# Patient Record
Sex: Male | Born: 1985 | Race: White | Hispanic: No | Marital: Married | Smoking: Never smoker
Health system: Southern US, Community
[De-identification: ages and names within clinical notes are randomized; demographics above are authoritative.]

## PROBLEM LIST (undated history)

## (undated) DIAGNOSIS — J45909 Unspecified asthma, uncomplicated: Secondary | ICD-10-CM

## (undated) DIAGNOSIS — J302 Other seasonal allergic rhinitis: Secondary | ICD-10-CM

## (undated) HISTORY — DX: Unspecified asthma, uncomplicated: J45.909

## (undated) HISTORY — DX: Other seasonal allergic rhinitis: J30.2

---

## 2007-07-12 LAB — LIPID PANEL: Cholesterol: 168 mg/dL (ref 0–200)

## 2010-04-30 LAB — CBC AND DIFFERENTIAL
Hemoglobin: 16 g/dL (ref 13.5–17.5)
Platelets: 295 10*3/uL (ref 150–399)
WBC: 8.9 10^3/mL

## 2010-04-30 LAB — TSH: TSH: 1.5 u[IU]/mL (ref <=5.90)

## 2010-04-30 LAB — HEPATIC FUNCTION PANEL: Alkaline Phosphatase: 114 U/L (ref 25–125)

## 2010-04-30 LAB — BASIC METABOLIC PANEL: Creatinine: 0.9 mg/dL (ref <=1.3)

## 2010-04-30 LAB — HEMOGLOBIN A1C: Hgb A1c MFr Bld: 5.4 % (ref 4.0–6.0)

## 2011-11-09 ENCOUNTER — Ambulatory Visit (INDEPENDENT_AMBULATORY_CARE_PROVIDER_SITE_OTHER): Payer: Self-pay | Admitting: Family Medicine

## 2011-11-09 ENCOUNTER — Encounter: Payer: Self-pay | Admitting: Family Medicine

## 2011-11-09 VITALS — BP 139/95 | HR 91 | Wt 190.0 lb

## 2011-11-09 DIAGNOSIS — J302 Other seasonal allergic rhinitis: Secondary | ICD-10-CM

## 2011-11-09 DIAGNOSIS — R1310 Dysphagia, unspecified: Secondary | ICD-10-CM

## 2011-11-09 DIAGNOSIS — J309 Allergic rhinitis, unspecified: Secondary | ICD-10-CM

## 2011-11-09 DIAGNOSIS — R030 Elevated blood-pressure reading, without diagnosis of hypertension: Secondary | ICD-10-CM

## 2011-11-09 DIAGNOSIS — Z13220 Encounter for screening for lipoid disorders: Secondary | ICD-10-CM

## 2011-11-09 DIAGNOSIS — Z0000 Encounter for general adult medical examination without abnormal findings: Secondary | ICD-10-CM

## 2011-11-09 NOTE — Progress Notes (Signed)
CC: Austin Powers is a 26 y.o. male is here for Establish Care and lump in throat   Subjective: HPI:  Pleasant 26 year old recently moved from [facility] here to establish care.  His only acute complaint is a lump in the throat and a sensation of food getting stuck in his throat. This is been present for 2-3 months and is getting worse. Initially was just with foods but now occurs with some fluids and even with spit.  He believes she's also been spitting up a little bit when he is roughhousing with his kids and bending forward. He denies vomiting otherwise, nausea, abdominal pain, or sour taste in the back of his mouth. He denies any swelling of the neck or trouble breathing. He denies tobacco use and classifies himself as a rare alcohol drinker. He used an over-the-counter antacid for couple days and that didn't seem to help his symptoms.  No other interventions. Nothing else seems to make it better or worse. He's never had this before. He denies fevers, chills, shortness of breath, cough, unintentional weight loss, swollen lymph nodes, posterior neck pain, nor chest pain. Denies constipation or diarrhea  Pointed out his blood pressure today, stage I hypertension, he tells me he's never been told that he has elevated blood pressure in the past but admits that he is somewhat anxious being a new doctor's office  Review Of Systems Outlined In HPI  Past Medical History  Diagnosis Date  . Seasonal allergies      No family history on file.   History  Substance Use Topics  . Smoking status: Never Smoker   . Smokeless tobacco: Not on file  . Alcohol Use: Yes     Objective: Filed Vitals:   11/09/11 0913  BP: 139/95  Pulse: 91    General: Alert and Oriented, No Acute Distress HEENT: Pupils equal, round, reactive to light. Conjunctivae clear.  External ears unremarkable, canals clear with intact TMs with appropriate landmarks.  Middle ear appears open without effusion. Pink  inferior turbinates.  Moist mucous membranes, pharynx without inflammation nor lesions.  Neck supple without palpable lymphadenopathy nor abnormal masses. Lungs: Clear to auscultation bilaterally, no wheezing/ronchi/rales.  Comfortable work of breathing. Good air movement. Cardiac: Regular rate and rhythm. Normal S1/S2.  No murmurs, rubs, nor gallops.   Abdomen: Normal bowel sounds, soft and non tender without palpable masses. Mental Status: No depression, anxiety, nor agitation. Skin: Warm and dry.  Assessment & Plan: Caster was seen today for establish care and lump in throat.  Diagnoses and associated orders for this visit:  Seasonal allergies  Elevated blood-pressure reading without diagnosis of hypertension - BASIC METABOLIC PANEL WITH GFR  Dysphagia - DG Esophagus; Future  Need for lipid screening - Lipid panel  Routine health maintenance - Lipid panel - BASIC METABOLIC PANEL WITH GFR    Esophageal motility study as above, asked him to return in 3-4 weeks for complete physical exam at that point we'll assess if he still has an elevated blood pressure that needs to be treated. He believes it's been more than 5 years since his last cholesterol check, he does not believe it is ever been screened for diabetes therefore labs above have been ordered to be obtained just prior to his complete physical exam.   Return in about 4 days (around 11/13/2011).

## 2011-11-09 NOTE — Patient Instructions (Signed)
I will call you once your swallow test results are back.

## 2011-11-14 ENCOUNTER — Encounter: Payer: Self-pay | Admitting: Family Medicine

## 2011-11-16 ENCOUNTER — Encounter: Payer: Self-pay | Admitting: Family Medicine

## 2011-11-16 DIAGNOSIS — Z87898 Personal history of other specified conditions: Secondary | ICD-10-CM | POA: Insufficient documentation

## 2011-11-16 DIAGNOSIS — Z872 Personal history of diseases of the skin and subcutaneous tissue: Secondary | ICD-10-CM | POA: Insufficient documentation

## 2011-11-16 DIAGNOSIS — N503 Cyst of epididymis: Secondary | ICD-10-CM | POA: Insufficient documentation

## 2011-11-24 LAB — LIPID PANEL
Cholesterol: 161 mg/dL (ref 0–200)
HDL: 33 mg/dL — ABNORMAL LOW (ref >39)
Total CHOL/HDL Ratio: 4.9 Ratio
Triglycerides: 167 mg/dL — ABNORMAL HIGH (ref <150)

## 2011-11-25 ENCOUNTER — Encounter: Payer: Self-pay | Admitting: Family Medicine

## 2011-11-25 LAB — BASIC METABOLIC PANEL WITH GFR
BUN: 12 mg/dL (ref 6–23)
CO2: 24 mEq/L (ref 19–32)
Calcium: 10.1 mg/dL (ref 8.4–10.5)
GFR, Est African American: >89 mL/min
Glucose, Bld: 85 mg/dL (ref 70–99)
Sodium: 136 mEq/L (ref 135–145)

## 2011-11-29 ENCOUNTER — Encounter: Payer: Self-pay | Admitting: Family Medicine

## 2011-11-30 ENCOUNTER — Encounter: Payer: Self-pay | Admitting: Family Medicine

## 2012-02-25 ENCOUNTER — Other Ambulatory Visit: Payer: Self-pay

## 2012-03-14 ENCOUNTER — Encounter: Payer: Self-pay | Admitting: Family Medicine

## 2012-03-15 ENCOUNTER — Telehealth: Payer: Self-pay | Admitting: Family Medicine

## 2012-03-15 DIAGNOSIS — Z3009 Encounter for other general counseling and advice on contraception: Secondary | ICD-10-CM

## 2012-03-15 NOTE — Telephone Encounter (Signed)
Sue Lush, Will you please let Austin Powers know that I've had guys happy with [facility], they're based out of [place].  I'll put in a referral for him.

## 2012-03-15 NOTE — Telephone Encounter (Signed)
Left message on vm

## 2012-11-15 ENCOUNTER — Other Ambulatory Visit: Payer: Self-pay

## 2013-01-09 DIAGNOSIS — N50819 Testicular pain, unspecified: Secondary | ICD-10-CM | POA: Insufficient documentation

## 2013-06-12 ENCOUNTER — Encounter: Payer: Self-pay | Admitting: Family Medicine

## 2013-06-12 ENCOUNTER — Telehealth: Payer: Self-pay | Admitting: Family Medicine

## 2013-06-12 DIAGNOSIS — Z8379 Family history of other diseases of the digestive system: Secondary | ICD-10-CM

## 2013-06-12 NOTE — Telephone Encounter (Signed)
Left message on vm and lab order up front

## 2013-06-12 NOTE — Telephone Encounter (Signed)
Seth Bake, Will you please make this (labs in inbox) available to patient, I'm emailing him that these labs are available.  From: Sundra Aland Sent: 06/12/2013 7:08 AM To: [facility] Clinical Pool Subject: Non-Urgent Medical Question My daughter (who has T1 diabetes) was diagnosed with Celiac disease last month. Her GI doctor, Dr. Marissa Nestle, recommended all of our immediate family be tested for Celiac as well. Is this a blood test I could have done at the lab in the [place] office to see if I might also have some degree of Celiac disease? Thank you!

## 2013-06-14 ENCOUNTER — Encounter: Payer: Self-pay | Admitting: Family Medicine

## 2013-06-14 ENCOUNTER — Ambulatory Visit (INDEPENDENT_AMBULATORY_CARE_PROVIDER_SITE_OTHER): Payer: BLUE CROSS/BLUE SHIELD | Admitting: Family Medicine

## 2013-06-14 VITALS — BP 133/91 | HR 92 | Ht 67.0 in | Wt 181.0 lb

## 2013-06-14 DIAGNOSIS — Z0000 Encounter for general adult medical examination without abnormal findings: Secondary | ICD-10-CM

## 2013-06-14 DIAGNOSIS — E785 Hyperlipidemia, unspecified: Secondary | ICD-10-CM

## 2013-06-14 DIAGNOSIS — L259 Unspecified contact dermatitis, unspecified cause: Secondary | ICD-10-CM

## 2013-06-14 DIAGNOSIS — L309 Dermatitis, unspecified: Secondary | ICD-10-CM

## 2013-06-14 DIAGNOSIS — Z131 Encounter for screening for diabetes mellitus: Secondary | ICD-10-CM

## 2013-06-14 DIAGNOSIS — R748 Abnormal levels of other serum enzymes: Secondary | ICD-10-CM

## 2013-06-14 LAB — COMPLETE METABOLIC PANEL WITH GFR
ALBUMIN: 4.8 g/dL (ref 3.5–5.2)
ALT: 28 U/L (ref 0–53)
AST: 19 U/L (ref 0–37)
Alkaline Phosphatase: 74 U/L (ref 39–117)
BUN: 10 mg/dL (ref 6–23)
CALCIUM: 9.6 mg/dL (ref 8.4–10.5)
CHLORIDE: 100 meq/L (ref 96–112)
CO2: 27 mEq/L (ref 19–32)
Creat: 0.93 mg/dL (ref 0.50–1.35)
GFR, Est African American: >89 mL/min
GFR, Est Non African American: >89 mL/min
Glucose, Bld: 91 mg/dL (ref 70–99)
POTASSIUM: 4.6 meq/L (ref 3.5–5.3)
SODIUM: 139 meq/L (ref 135–145)
TOTAL PROTEIN: 7.7 g/dL (ref 6.0–8.3)
Total Bilirubin: 0.8 mg/dL (ref 0.2–1.2)

## 2013-06-14 LAB — LIPID PANEL
CHOLESTEROL: 160 mg/dL (ref 0–200)
HDL: 28 mg/dL — ABNORMAL LOW (ref >39)
LDL Cholesterol: 107 mg/dL — ABNORMAL HIGH (ref 0–99)
Total CHOL/HDL Ratio: 5.7 Ratio
Triglycerides: 124 mg/dL (ref <150)
VLDL: 25 mg/dL (ref 0–40)

## 2013-06-14 MED ORDER — TRIAMCINOLONE ACETONIDE 0.1 % EX CREA: TOPICAL_CREAM | CUTANEOUS | Status: AC

## 2013-06-14 NOTE — Patient Instructions (Addendum)
Dr. Lajoyce Lauber General Advice Following Your Complete Physical Exam  The Benefits of Regular Exercise: Unless you suffer from an uncontrolled cardiovascular condition, studies strongly suggest that regular exercise and physical activity will add to both the quality and length of your life.  The World Health Organization recommends 150 minutes of moderate intensity aerobic activity every week.  This is best split over 3-4 days a week, and can be as simple as a brisk walk for just over 35 minutes "most days of the week".  This type of exercise has been shown to lower LDL-Cholesterol, lower average blood sugars, lower blood pressure, lower cardiovascular disease risk, improve memory, and increase one's overall sense of wellbeing.  The addition of anaerobic (or "strength training") exercises offers additional benefits including but not limited to increased metabolism, prevention of osteoporosis, and improved overall cholesterol levels.  How Can I Strive For A Low-Fat Diet?: Current guidelines recommend that 25-35 percent of your daily energy (food) intake should come from fats.  One might ask how can this be achieved without having to dissect each meal on a daily basis?  Switch to skim or 1% milk instead of whole milk.  Focus on lean meats such as ground [place], fresh fish, baked chicken, and lean cuts of beef as your source of dietary protein.  Limit saturated fat consumption to less than 10% of your daily caloric intake.  Limit trans fatty acid consumption primarily by limiting synthetic trans fats such as partially hydrogenated oils (Ex: fried fast foods).  Substitute olive or vegetable oil for solid fats where possible.  Moderation of Salt Intake: Provided you don't carry a diagnosis of congestive heart failure nor renal failure, I recommend a daily allowance of no more than 2300 mg of salt (sodium).  Keeping under this daily goal is associated with a decreased risk of cardiovascular events, creeping  above it can lead to elevated blood pressures and increases your risk of cardiovascular events.  Milligrams (mg) of salt is listed on all nutrition labels, and your daily intake can add up faster than you think.  Most canned and frozen dinners can pack in over half your daily salt allowance in one meal.    Lifestyle Health Risks: Certain lifestyle choices carry specific health risks.  As you may already know, tobacco use has been associated with increasing one's risk of cardiovascular disease, pulmonary disease, numerous cancers, among many other issues.  What you may not know is that there are medications and nicotine replacement strategies that can more than double your chances of successfully quitting.  I would be thrilled to help manage your quitting strategy if you currently use tobacco products.  When it comes to alcohol use, I've yet to find an "ideal" daily allowance.  Provided an individual does not have a medical condition that is exacerbated by alcohol consumption, general guidelines determine "safe drinking" as no more than two standard drinks for a man or no more than one standard drink for a male per day.  However, much debate still exists on whether any amount of alcohol consumption is technically "safe".  My general advice, keep alcohol consumption to a minimum for general health promotion.  If you or others believe that alcohol, tobacco, or recreational drug use is interfering with your life, I would be happy to provide confidential counseling regarding treatment options.  General "Over The Counter" Nutrition Advice: Postmenopausal women should aim for a daily calcium intake of 1200 mg, however a significant portion of this might already be  provided by diets including milk, yogurt, cheese, and other dairy products.  Vitamin D has been shown to help preserve bone density, prevent fatigue, and has even been shown to help reduce falls in the elderly.  Ensuring a daily intake of 800 Units of  Vitamin D is a good place to start to enjoy the above benefits, we can easily check your Vitamin D level to see if you'd potentially benefit from supplementation beyond 800 Units a day.  Folic Acid intake should be of particular concern to women of childbearing age.  Daily consumption of 413-244 mcg of Folic Acid is recommended to minimize the chance of spinal cord defects in a fetus should pregnancy occur.    For many adults, accidents still remain one of the most common culprits when it comes to cause of death.  Some of the simplest but most effective preventitive habits you can adopt include regular seatbelt use, proper helmet use, securing firearms, and regularly testing your smoke and carbon monoxide detectors.  Barnett Elzey B. [place] [address], [place], [place] [address] Phone: [phone]  Testicular Self-Exam A self-examination of your testicles involves looking at and feeling your testicles for abnormal lumps or swelling. Several things can cause swelling, lumps, or pain in your testicles. Some of these causes are:  Injuries.  Inflammation.  Infection.  Accumulation of fluids around your testicle (hydrocele).  Twisted testicles (testicular torsion).  Testicular cancer. Self-examination of the testicles and groin areas may be advised if you are at risk for testicular cancer. Risks for testicular cancer include:  An undescended testicle (cryptorchidism).  A history of previous testicular cancer.  A family history of testicular cancer. The testicles are easiest to examine after warm baths or showers and are more difficult to examine when you are cold. This is because the muscles attached to the testicles retract and pull them up higher or into the abdomen. Follow these steps while you are standing:  Hold your penis away from your body.  Roll one testicle between your thumb and forefinger, feeling the entire testicle.  Roll the other  testicle between your thumb and forefinger, feeling the entire testicle. Feel for lumps, swelling, or discomfort. A normal testicle is egg shaped and feels firm. It is smooth and not tender. The spermatic cord can be felt as a firm spaghetti-like cord at the back of your testicle. It is also important to examine the crease between the front of your leg and your abdomen. Feel for any bumps that are tender. These could be enlarged lymph nodes.  Document Released: 04/04/2000 Document Revised: 08/29/2012 Document Reviewed: 06/18/2012 [facility] Patient Information 2014 [place], [place].

## 2013-06-14 NOTE — Progress Notes (Signed)
CC: Austin Powers is a 28 y.o. male is here for Annual Exam   Subjective: HPI:  Colonoscopy: No current indication Prostate: Discussed screening risks/beneifts with patient during today's visit, no current indication for prostate cancer screening  Influenza Vaccine: Out of season Pneumovax: No current indication Td/Tdap: Tdap 2014 Zoster: (Start 30 yo)  No alcohol, tobacco, recreational drug use. No formal exercise routine. He has significantly changed his diet over the past month due to a daughter who was diagnosed with celiac disease. He's eating much more vegetables now.  His only complaint is a rash on the back that is itchy but he believes may be eczema, improves with moisturizers  Review of Systems - General ROS: negative for - chills, fever, night sweats, weight gain or weight loss Ophthalmic ROS: negative for - decreased vision Psychological ROS: negative for - anxiety or depression ENT ROS: negative for - hearing change, nasal congestion, tinnitus or allergies Hematological and Lymphatic ROS: negative for - bleeding problems, bruising or swollen lymph nodes Breast ROS: negative Respiratory ROS: no cough, shortness of breath, or wheezing Cardiovascular ROS: no chest pain or dyspnea on exertion Gastrointestinal ROS: no abdominal pain, change in bowel habits, or black or bloody stools Genito-Urinary ROS: negative for - genital discharge, genital ulcers, incontinence or abnormal bleeding from genitals Musculoskeletal ROS: negative for - joint pain or muscle pain Neurological ROS: negative for - headaches or memory loss Dermatological ROS: negative for lumps, mole changes, rash and skin lesion changes other than that described above  Past Medical History  Diagnosis Date  . Seasonal allergies   . Asthma     No past surgical history on file. No family history on file.  History   Social History  . Marital Status: Married    Spouse Name: N/A    Number of Children: N/A  .  Years of Education: N/A   Occupational History  . Not on file.   Social History Main Topics  . Smoking status: Never Smoker   . Smokeless tobacco: Not on file  . Alcohol Use: Yes  . Drug Use: No  . Sexual Activity: Not on file   Other Topics Concern  . Not on file   Social History Narrative  . No narrative on file     Objective: BP 133/91  Pulse 92  Ht 5\' 7"  (1.702 m)  Wt 181 lb (82.101 kg)  BMI 28.34 kg/m2  General: No Acute Distress HEENT: Atraumatic, normocephalic, conjunctivae normal without scleral icterus.  No nasal discharge, hearing grossly intact, TMs with good landmarks bilaterally with no middle ear abnormalities, posterior pharynx clear without oral lesions. Neck: Supple, trachea midline, no cervical nor supraclavicular adenopathy. Pulmonary: Clear to auscultation bilaterally without wheezing, rhonchi, nor rales. Cardiac: Regular rate and rhythm.  No murmurs, rubs, nor gallops. No peripheral edema.  2+ peripheral pulses bilaterally. Abdomen: Bowel sounds normal.  No masses.  Non-tender without rebound.  Negative Murphy's sign. GU: Bilateral descended testes without inguinal hernia MSK: Grossly intact, no signs of weakness.  Full strength throughout upper and lower extremities.  Full ROM in upper and lower extremities.  No midline spinal tenderness. Neuro: Gait unremarkable, CN II-XII grossly intact.  C5-C6 Reflex 2/4 Bilaterally, L4 Reflex 2/4 Bilaterally.  Cerebellar function intact. Skin: Mild eczema on the back by the left axilla no other rashes or suspicious lesions Psych: Alert and oriented to person/place/time.  Thought process normal. No anxiety/depression.  Assessment & Plan: Xzaviar was seen today for annual exam.  Diagnoses and  associated orders for this visit:  Annual physical exam  Dyslipidemia - Lipid panel  Diabetes mellitus screening - COMPLETE METABOLIC PANEL WITH GFR  Elevated liver enzymes - COMPLETE METABOLIC PANEL WITH  GFR  Eczema - triamcinolone cream (KENALOG) 0.1 %; Apply to affected areas twice a day for up to two weeks, avoid face.    Healthy lifestyle interventions including but not limited to regular exercise, a healthy low fat diet, moderation of salt intake, the dangers of tobacco/alcohol/recreational drug use, nutrition supplementation, and accident avoidance were discussed with the patient and a handout was provided for future reference. Due for annual lipid panel, checking liver enzymes due to history of elevation, routine diabetic screening Triamcinolone for any eczema flares  Return in about 1 year (around 06/15/2014).

## 2013-06-15 LAB — IGA: IgA: 248 mg/dL (ref 68–379)

## 2013-06-18 LAB — RETICULIN ANTIBODIES, IGA W TITER: RETICULIN AB, IGA: NEGATIVE

## 2013-06-19 ENCOUNTER — Telehealth: Payer: Self-pay | Admitting: Family Medicine

## 2013-06-19 DIAGNOSIS — R768 Other specified abnormal immunological findings in serum: Secondary | ICD-10-CM

## 2013-06-19 NOTE — Telephone Encounter (Signed)
Seth Bake, Will you please let Curt know that his celiac tests came back positive.  This does not confirm the diagnosis but it makes it highly likely especially with his family history.  I've placed a GI referral so he can discuss whether or not a endoscopy is needed to confirm the diagnosis.  Please let me know if not contacted about this by next week.

## 2013-06-19 NOTE — Telephone Encounter (Signed)
Pt.notified

## 2013-06-27 LAB — GLIADIN ANTIBODIES, SERUM
GLIADIN IGA: 15 U/mL (ref <20)
Gliadin IgG: 96.4 U/mL — ABNORMAL HIGH (ref <20)

## 2013-06-27 LAB — TISSUE TRANSGLUTAMINASE, IGA: TISSUE TRANSGLUTAMINASE AB, IGA: 63.4 U/mL — AB (ref <20)

## 2013-07-25 ENCOUNTER — Encounter: Payer: Self-pay | Admitting: Family Medicine

## 2014-04-13 ENCOUNTER — Encounter: Payer: Self-pay | Admitting: Family Medicine

## 2014-04-13 DIAGNOSIS — M79672 Pain in left foot: Secondary | ICD-10-CM

## 2014-04-15 ENCOUNTER — Ambulatory Visit (INDEPENDENT_AMBULATORY_CARE_PROVIDER_SITE_OTHER): Payer: BLUE CROSS/BLUE SHIELD

## 2014-04-15 DIAGNOSIS — R936 Abnormal findings on diagnostic imaging of limbs: Secondary | ICD-10-CM

## 2014-04-15 DIAGNOSIS — M79672 Pain in left foot: Secondary | ICD-10-CM

## 2014-04-16 ENCOUNTER — Encounter: Payer: Self-pay | Admitting: Sports Medicine

## 2014-04-16 ENCOUNTER — Ambulatory Visit (INDEPENDENT_AMBULATORY_CARE_PROVIDER_SITE_OTHER): Payer: BLUE CROSS/BLUE SHIELD | Admitting: Sports Medicine

## 2014-04-16 VITALS — BP 133/89 | HR 69 | Wt 160.0 lb

## 2014-04-16 DIAGNOSIS — M7750 Other enthesopathy of unspecified foot: Secondary | ICD-10-CM | POA: Insufficient documentation

## 2014-04-16 DIAGNOSIS — M79672 Pain in left foot: Secondary | ICD-10-CM

## 2014-04-16 MED ORDER — MELOXICAM 15 MG PO TABS: ORAL_TABLET | ORAL | Status: DC

## 2014-04-16 NOTE — Progress Notes (Signed)
   Subjective:    I'm seeing this patient as a consultation for:  Dr. Ileene Rubens  CC: left foot pain  HPI: This is a pleasant 29 year old male, for the past several weeks he's had increasing pain that he localizes over the dorsum of the first metatarsal shaft on the left, he runs approximately 13 miles per week and would like to increase. Unfortunately she has not had any effect from over-the-counter inserts. Symptoms are moderate, persistent without radiation. No trauma.  Past medical history, Surgical history, Family history not pertinant except as noted below, Social history, Allergies, and medications have been entered into the medical record, reviewed, and no changes needed.   Review of Systems: No headache, visual changes, nausea, vomiting, diarrhea, constipation, dizziness, abdominal pain, skin rash, fevers, chills, night sweats, weight loss, swollen lymph nodes, body aches, joint swelling, muscle aches, chest pain, shortness of breath, mood changes, visual or auditory hallucinations.   Objective:   General: Well Developed, well nourished, and in no acute distress.  Neuro/Psych: Alert and oriented x3, extra-ocular muscles intact, able to move all 4 extremities, sensation grossly intact. Skin: Warm and dry, no rashes noted.  Respiratory: Not using accessory muscles, speaking in full sentences, trachea midline.  Cardiovascular: Pulses palpable, no extremity edema. Abdomen: Does not appear distended. Left Foot: No visible erythema or swelling. Range of motion is full in all directions. Strength is 5/5 in all directions. No hallux valgus. Pes cavus No abnormal callus noted. No pain over the navicular prominence, or base of fifth metatarsal. No tenderness to palpation of the calcaneal insertion of plantar fascia. No pain at the Achilles insertion. No pain over the calcaneal bursa. No pain of the retrocalcaneal bursa. Tender to palpation over the first metatarsal shaft dorsally. No  hallux rigidus or limitus. No tenderness palpation over interphalangeal joints. No pain with compression of the metatarsal heads. Neurovascularly intact distally.  X-rays reviewed and are essentially unremarkable with regards to his first metatarsal.  Impression and Recommendations:   This case required medical decision making of moderate complexity.

## 2014-04-16 NOTE — Assessment & Plan Note (Addendum)
Symptoms likely represent 1st MT stress reaction in this patient with moderate pes cavus. Patient will return for custom orthotics. We will also do a gait analysis at that time. X-rays were negative. Adding meloxicam in the meantime. Does approximately 13 miles per week. We may need to add first metatarsal ray post.

## 2014-04-21 ENCOUNTER — Encounter: Payer: Self-pay | Admitting: Sports Medicine

## 2014-05-02 ENCOUNTER — Encounter: Payer: Self-pay | Admitting: Sports Medicine

## 2014-05-02 ENCOUNTER — Ambulatory Visit (INDEPENDENT_AMBULATORY_CARE_PROVIDER_SITE_OTHER): Payer: BLUE CROSS/BLUE SHIELD | Admitting: Sports Medicine

## 2014-05-02 VITALS — BP 135/90 | HR 67 | Ht 67.0 in | Wt 157.0 lb

## 2014-05-02 DIAGNOSIS — M79672 Pain in left foot: Secondary | ICD-10-CM | POA: Diagnosis not present

## 2014-05-02 NOTE — Progress Notes (Signed)

## 2014-05-02 NOTE — Assessment & Plan Note (Signed)
Administrator as above. Good response and feeling significantly better. Meloxicam has been helpful. Continue to do approximately 13 miles per week, return to see me in one month to see if things are going.

## 2014-06-17 ENCOUNTER — Ambulatory Visit (INDEPENDENT_AMBULATORY_CARE_PROVIDER_SITE_OTHER): Payer: BLUE CROSS/BLUE SHIELD | Admitting: Sports Medicine

## 2014-06-17 ENCOUNTER — Encounter: Payer: Self-pay | Admitting: Sports Medicine

## 2014-06-17 VITALS — BP 140/88 | HR 71 | Ht 67.0 in | Wt 154.0 lb

## 2014-06-17 DIAGNOSIS — M7632 Iliotibial band syndrome, left leg: Secondary | ICD-10-CM | POA: Diagnosis not present

## 2014-06-17 DIAGNOSIS — M79672 Pain in left foot: Secondary | ICD-10-CM | POA: Diagnosis not present

## 2014-06-17 DIAGNOSIS — M79671 Pain in right foot: Secondary | ICD-10-CM | POA: Diagnosis not present

## 2014-06-17 NOTE — Assessment & Plan Note (Signed)
It has now been 3 weeks of rehabilitation exercises without any improvement at all. He continues to do approximately 13 miles per week. Injection under the IT band of the lateral femoral condyle today. No running for a week, return to see me in a month.

## 2014-06-17 NOTE — Progress Notes (Signed)
  Subjective:    CC: Multiple issues  HPI: Left knee pain: With running, popping sensation over the lateral femoral condyle, has been doing stretches for IT band syndrome, unfortunately having persistent pain after over one month.  Foot pain: Left orthotic arch feels a bit high, I'm happy to plan this down a bit, foot has some pain between the third and fourth metatarsal heads, predominantly on the plantar aspect. The  Past medical history, Surgical history, Family history not pertinant except as noted below, Social history, Allergies, and medications have been entered into the medical record, reviewed, and no changes needed.   Review of Systems: No fevers, chills, night sweats, weight loss, chest pain, or shortness of breath.   Objective:    General: Well Developed, well nourished, and in no acute distress.  Neuro: Alert and oriented x3, extra-ocular muscles intact, sensation grossly intact.  HEENT: Normocephalic, atraumatic, pupils equal round reactive to light, neck supple, no masses, no lymphadenopathy, thyroid nonpalpable.  Skin: Warm and dry, no rashes. Cardiac: Regular rate and rhythm, no murmurs rubs or gallops, no lower extremity edema.  Respiratory: Clear to auscultation bilaterally. Not using accessory muscles, speaking in full sentences. Right Foot: No visible erythema or swelling. Range of motion is full in all directions. Strength is 5/5 in all directions. No hallux valgus. No pes cavus or pes planus. No abnormal callus noted. No pain over the navicular prominence, or base of fifth metatarsal. No tenderness to palpation of the calcaneal insertion of plantar fascia. No pain at the Achilles insertion. No pain over the calcaneal bursa. No pain of the retrocalcaneal bursa. No tenderness to palpation over the tarsals, metatarsals, or phalanges. No hallux rigidus or limitus. No tenderness palpation over interphalangeal joints. Minimal tenderness on the plantar aspect of  the third/fourth metatarsal heads as well as the intermetatarsal bursa. Neurovascularly intact distally.  Procedure: Real-time Ultrasound Guided Injection of left iliotibial band Device: GE Logiq E  Verbal informed consent obtained.  Time-out conducted.  Noted no overlying erythema, induration, or other signs of local infection.  Skin prepped in a sterile fashion.  Local anesthesia: Topical Ethyl chloride.  With sterile technique and under real time ultrasound guidance:  A total of 1 mL kenalog 40, 2 mL lidocaine injected just deep to the iliotibial band as it crosses the lateral femoral condyle. Completed without difficulty  Pain immediately resolved suggesting accurate placement of the medication.  Advised to call if fevers/chills, erythema, induration, drainage, or persistent bleeding.  Images permanently stored and available for review in the ultrasound unit.  Impression: Technically successful ultrasound guided injection.  Impression and Recommendations:

## 2014-06-17 NOTE — Assessment & Plan Note (Signed)
Overall doing well with custom orthotics. I did plane down the left orthotic arch a little bit today. I added a metatarsal pad on the right orthotic, he probably has intermetatarsal bursitis at the 3/4 junction. Return in one month regarding all of this.

## 2014-07-22 ENCOUNTER — Ambulatory Visit (INDEPENDENT_AMBULATORY_CARE_PROVIDER_SITE_OTHER): Payer: BLUE CROSS/BLUE SHIELD | Admitting: Family Medicine

## 2014-07-22 ENCOUNTER — Encounter: Payer: Self-pay | Admitting: Family Medicine

## 2014-07-22 ENCOUNTER — Ambulatory Visit (INDEPENDENT_AMBULATORY_CARE_PROVIDER_SITE_OTHER): Payer: BLUE CROSS/BLUE SHIELD | Admitting: Sports Medicine

## 2014-07-22 ENCOUNTER — Encounter: Payer: Self-pay | Admitting: Sports Medicine

## 2014-07-22 VITALS — BP 120/77 | HR 69 | Ht 67.0 in | Wt 148.0 lb

## 2014-07-22 DIAGNOSIS — Z0000 Encounter for general adult medical examination without abnormal findings: Secondary | ICD-10-CM | POA: Diagnosis not present

## 2014-07-22 DIAGNOSIS — M7632 Iliotibial band syndrome, left leg: Secondary | ICD-10-CM

## 2014-07-22 DIAGNOSIS — M79672 Pain in left foot: Secondary | ICD-10-CM

## 2014-07-22 DIAGNOSIS — M79671 Pain in right foot: Secondary | ICD-10-CM

## 2014-07-22 NOTE — Progress Notes (Signed)
CC: Austin Powers is a 29 y.o. male is here for Annual Exam   Subjective: HPI:  Colonoscopy: No current indication Prostate: Discussed screening risks/beneifts with patient during today's visit, no current indication for prostate cancer screening  Influenza Vaccine: Out of season Pneumovax: No current indication Td/Tdap: Tdap 2014 Zoster: (Start 31 yo)  No alcohol, tobacco, recreational drug use. No formal exercise routine. He has significantly changed his diet over the past year due to a daughter who was diagnosed with celiac disease.  His only complaint is finding a tick in the suprapubic region this morning. He took it off without difficulty. He's had no rash, fevers, headache, chills, new joint pain or muscle aches.  Review of Systems - General ROS: negative for - chills, fever, night sweats, weight gain or weight loss Ophthalmic ROS: negative for - decreased vision Psychological ROS: negative for - anxiety or depression ENT ROS: negative for - hearing change, nasal congestion, tinnitus or allergies Hematological and Lymphatic ROS: negative for - bleeding problems, bruising or swollen lymph nodes Breast ROS: negative Respiratory ROS: no cough, shortness of breath, or wheezing Cardiovascular ROS: no chest pain or dyspnea on exertion Gastrointestinal ROS: no abdominal pain, change in bowel habits, or black or bloody stools Genito-Urinary ROS: negative for - genital discharge, genital ulcers, incontinence or abnormal bleeding from genitals Musculoskeletal ROS: negative for - joint pain or muscle pain Neurological ROS: negative for - headaches or memory loss Dermatological ROS: negative for lumps, mole changes, rash and skin lesion changes  Past Medical History  Diagnosis Date  . Seasonal allergies   . Asthma     No past surgical history on file. No family history on file.  History   Social History  . Marital Status: Married    Spouse Name: N/A  . Number of Children: N/A  .  Years of Education: N/A   Occupational History  . Not on file.   Social History Main Topics  . Smoking status: Never Smoker   . Smokeless tobacco: Not on file  . Alcohol Use: Yes  . Drug Use: No  . Sexual Activity: Not on file   Other Topics Concern  . Not on file   Social History Narrative     Objective: BP 120/77 mmHg  Pulse 69  Ht 5\' 7"  (1.702 m)  Wt 148 lb (67.132 kg)  BMI 23.17 kg/m2  General: No Acute Distress HEENT: Atraumatic, normocephalic, conjunctivae normal without scleral icterus.  No nasal discharge, hearing grossly intact, TMs with good landmarks bilaterally with no middle ear abnormalities, posterior pharynx clear without oral lesions. Neck: Supple, trachea midline, no cervical nor supraclavicular adenopathy. Pulmonary: Clear to auscultation bilaterally without wheezing, rhonchi, nor rales. Cardiac: Regular rate and rhythm.  No murmurs, rubs, nor gallops. No peripheral edema.  2+ peripheral pulses bilaterally. Abdomen: Bowel sounds normal.  No masses.  Non-tender without rebound.  Negative Murphy's sign. MSK: Grossly intact, no signs of weakness.  Full strength throughout upper and lower extremities.  Full ROM in upper and lower extremities.  No midline spinal tenderness. Neuro: Gait unremarkable, CN II-XII grossly intact.  C5-C6 Reflex 2/4 Bilaterally, L4 Reflex 2/4 Bilaterally.  Cerebellar function intact. Skin: No rashes other than some mild erythema in the suprapubic region where he removed a tick this morning. No signs of infection. Psych: Alert and oriented to person/place/time.  Thought process normal. No anxiety/depression.  Assessment & Plan: Austin Powers was seen today for annual exam.  Diagnoses and all orders for this visit:  Annual physical exam Orders: -     Lipid panel -     CBC -     COMPLETE METABOLIC PANEL WITH GFR   Healthy lifestyle interventions including but not limited to regular exercise, a healthy low fat diet, moderation of salt  intake, the dangers of tobacco/alcohol/recreational drug use, nutrition supplementation, and accident avoidance were discussed with the patient and a handout was provided for future reference.  Discussed signs and symptoms of Lyme disease were [facility] spotted fever and that if he begins to experience any of these to call me and I will call in doxycycline. Very low suspicion of [facility] spotted fever or Lyme's disease at this time.  Return in about 1 year (around 07/22/2015).

## 2014-07-22 NOTE — Assessment & Plan Note (Signed)
Foot pain and right 3/4 intermetatarsal bursitis has resolved with metatarsal pad placement. I have given a couple of scaphoid pads to place should he desire increased support the arch. Return as needed.

## 2014-07-22 NOTE — Assessment & Plan Note (Signed)
Injection was only minimally effective for iliotibial band syndrome. Small compression brace has been significantly more effective. Continue running, next up would be MRI of the knee. Symptoms continue be described as a tightness along the lateral thigh and vastus lateralis.

## 2014-07-22 NOTE — Progress Notes (Signed)
  Subjective:    CC: Follow-up  HPI: Right foot pain: 3/4 intermetatarsal bursitis: Resolved with metatarsal pad placement in orthotics.  Left IT band syndrome: Injection was only minimally effective, he did buy a compression brace that has been more effective. Happy with how things are going so far.  Past medical history, Surgical history, Family history not pertinant except as noted below, Social history, Allergies, and medications have been entered into the medical record, reviewed, and no changes needed.   Review of Systems: No fevers, chills, night sweats, weight loss, chest pain, or shortness of breath.   Objective:    General: Well Developed, well nourished, and in no acute distress.  Neuro: Alert and oriented x3, extra-ocular muscles intact, sensation grossly intact.  HEENT: Normocephalic, atraumatic, pupils equal round reactive to light, neck supple, no masses, no lymphadenopathy, thyroid nonpalpable.  Skin: Warm and dry, no rashes. Cardiac: Regular rate and rhythm, no murmurs rubs or gallops, no lower extremity edema.  Respiratory: Clear to auscultation bilaterally. Not using accessory muscles, speaking in full sentences.  Impression and Recommendations:    I spent 25 minutes with this patient, greater than 50% was face-to-face time counseling regarding the above diagnoses

## 2014-07-23 LAB — COMPLETE METABOLIC PANEL WITH GFR
ALBUMIN: 4.9 g/dL (ref 3.5–5.2)
ALK PHOS: 68 U/L (ref 39–117)
ALT: 17 U/L (ref 0–53)
AST: 17 U/L (ref 0–37)
BUN: 10 mg/dL (ref 6–23)
CO2: 29 mEq/L (ref 19–32)
Calcium: 10 mg/dL (ref 8.4–10.5)
Chloride: 99 mEq/L (ref 96–112)
Creat: 0.87 mg/dL (ref 0.50–1.35)
GFR, Est Non African American: >89 mL/min
GLUCOSE: 87 mg/dL (ref 70–99)
Potassium: 5.1 mEq/L (ref 3.5–5.3)
Sodium: 141 mEq/L (ref 135–145)
TOTAL PROTEIN: 7.8 g/dL (ref 6.0–8.3)
Total Bilirubin: 1.2 mg/dL (ref 0.2–1.2)

## 2014-07-23 LAB — LIPID PANEL
Cholesterol: 155 mg/dL (ref 0–200)
HDL: 51 mg/dL (ref >=40)
LDL Cholesterol: 88 mg/dL (ref 0–99)
Total CHOL/HDL Ratio: 3 Ratio
Triglycerides: 82 mg/dL (ref <150)
VLDL: 16 mg/dL (ref 0–40)

## 2014-07-23 LAB — CBC
HCT: 47 % (ref 39.0–52.0)
HEMOGLOBIN: 16 g/dL (ref 13.0–17.0)
MCH: 30.7 pg (ref 26.0–34.0)
MCHC: 34 g/dL (ref 30.0–36.0)
MCV: 90.2 fL (ref 78.0–100.0)
MPV: 10 fL (ref 8.6–12.4)
Platelets: 289 10*3/uL (ref 150–400)
RBC: 5.21 MIL/uL (ref 4.22–5.81)
RDW: 13.4 % (ref 11.5–15.5)
WBC: 6.6 10*3/uL (ref 4.0–10.5)

## 2014-09-08 ENCOUNTER — Ambulatory Visit (INDEPENDENT_AMBULATORY_CARE_PROVIDER_SITE_OTHER): Payer: BLUE CROSS/BLUE SHIELD | Admitting: Family Medicine

## 2014-09-08 ENCOUNTER — Encounter: Payer: Self-pay | Admitting: Family Medicine

## 2014-09-08 VITALS — BP 140/86 | HR 87 | Wt 155.0 lb

## 2014-09-08 DIAGNOSIS — M7662 Achilles tendinitis, left leg: Secondary | ICD-10-CM

## 2014-09-08 MED ORDER — DICLOFENAC SODIUM 2 % TD SOLN: TRANSDERMAL | Status: DC

## 2014-09-08 NOTE — Progress Notes (Signed)
CC: Austin Powers is a 29 y.o. male is here for left ankle pain x 2 weeks   Subjective: HPI:  Complains of left posterior ankle pain present for the last 2 weeks. It's worse first thing in the morning. It's described as a sharpness that is nonradiating. He denies any recent trauma or overexertion. No interventions of his of yet other than rest. It does not seem to be getting better or worse and is mild in severity. It is worse if his knees in extension and he dorsiflexes the foot. With the foot dorsiflexed it improves if he bends the knee. Denies any overlying skin changes or swelling at the site of discomfort. Denies fevers, chills nor joint pain elsewhere. no pain with weightbearing.   Review Of Systems Outlined In HPI  Past Medical History  Diagnosis Date  . Seasonal allergies   . Asthma     No past surgical history on file. No family history on file.  Social History   Social History  . Marital Status: Married    Spouse Name: N/A  . Number of Children: N/A  . Years of Education: N/A   Occupational History  . Not on file.   Social History Main Topics  . Smoking status: Never Smoker   . Smokeless tobacco: Not on file  . Alcohol Use: Yes  . Drug Use: No  . Sexual Activity: Not on file   Other Topics Concern  . Not on file   Social History Narrative     Objective: BP 140/86 mmHg  Pulse 87  Wt 155 lb (70.308 kg)  Vital signs reviewed. General: Alert and Oriented, No Acute Distress HEENT: Pupils equal, round, reactive to light. Conjunctivae clear.  External ears unremarkable.  Moist mucous membranes. Lungs: Clear and comfortable work of breathing, speaking in full sentences without accessory muscle use. Cardiac: Regular rate and rhythm.  Neuro: CN II-XII grossly intact, gait normal. Extremities: No peripheral edema.  Strong peripheral pulses.  Left ankle exam: No pain at the medial or lateral malleoli. No pain with resisted eversion or inversion of the ankle. Anterior  drawer negative. Pain is reproduced with palpation where the lateral aspect of the Achilles tendon inserts on the calcaneus. Mental Status: No depression, anxiety, nor agitation. Logical though process. Skin: Warm and dry.  Assessment & Plan: [name] was seen today for left ankle pain x 2 weeks.  Diagnoses and all orders for this visit:  Left Achilles tendinitis -     Diclofenac Sodium (PENNSAID) 2 % SOLN; Apply to left ankle TID   Suspect mild Achilles tendinitis therefore start daily home rehabilitation exercises that were provided to him today. Discussed it may take up to 2 weeks for the pain to noticeably improved in the meantime try Pennsaid samples, if effective call for a formal prescription.  Return if symptoms worsen or fail to improve.

## 2015-03-18 ENCOUNTER — Encounter: Payer: Self-pay | Admitting: Family Medicine

## 2015-03-18 ENCOUNTER — Ambulatory Visit (INDEPENDENT_AMBULATORY_CARE_PROVIDER_SITE_OTHER): Payer: BLUE CROSS/BLUE SHIELD | Admitting: Family Medicine

## 2015-03-18 VITALS — BP 130/89 | HR 88 | Wt 152.0 lb

## 2015-03-18 DIAGNOSIS — Z0000 Encounter for general adult medical examination without abnormal findings: Secondary | ICD-10-CM | POA: Diagnosis not present

## 2015-03-18 NOTE — Progress Notes (Signed)
CC: Austin Powers is a 30 y.o. male is here for Annual Exam   Subjective: HPI:  Colonoscopy: No current indication Prostate: Discussed screening risks/beneifts with patient today, no current indication for screening   Influenza Vaccine: UTD Pneumovax: no indication Td/Tdap: UTD from 2014 Zoster: (Start 32 yo)  Requesting complete physical exam with no complaints other than finding out that his mother's thyroid function is underactive and he wonders if he can have this tested for himself. He has a few questions regarding resting heart rate and heart rate zones while exercising.   Review of Systems - General ROS: negative for - chills, fever, night sweats, weight gain or weight loss Ophthalmic ROS: negative for - decreased vision Psychological ROS: negative for - anxiety or depression ENT ROS: negative for - hearing change, nasal congestion, tinnitus or allergies Hematological and Lymphatic ROS: negative for - bleeding problems, bruising or swollen lymph nodes Breast ROS: negative Respiratory ROS: no cough, shortness of breath, or wheezing Cardiovascular ROS: no chest pain or dyspnea on exertion Gastrointestinal ROS: no abdominal pain, change in bowel habits, or black or bloody stools Genito-Urinary ROS: negative for - genital discharge, genital ulcers, incontinence or abnormal bleeding from genitals Musculoskeletal ROS: negative for - joint pain or muscle pain Neurological ROS: negative for - headaches or memory loss Dermatological ROS: negative for lumps, mole changes, rash and skin lesion changes  Past Medical History  Diagnosis Date  . Seasonal allergies   . Asthma     No past surgical history on file. No family history on file.  Social History   Social History  . Marital Status: Married    Spouse Name: N/A  . Number of Children: N/A  . Years of Education: N/A   Occupational History  . Not on file.   Social History Main Topics  . Smoking status: Never Smoker   .  Smokeless tobacco: Not on file  . Alcohol Use: Yes  . Drug Use: No  . Sexual Activity: Not on file   Other Topics Concern  . Not on file   Social History Narrative     Objective: BP 130/89 mmHg  Pulse 88  Wt 152 lb (68.947 kg)  General: No Acute Distress HEENT: Atraumatic, normocephalic, conjunctivae normal without scleral icterus.  No nasal discharge, hearing grossly intact, TMs with good landmarks bilaterally with no middle ear abnormalities, posterior pharynx clear without oral lesions. Neck: Supple, trachea midline, no cervical nor supraclavicular adenopathy. Pulmonary: Clear to auscultation bilaterally without wheezing, rhonchi, nor rales. Cardiac: Regular rate and rhythm.  No murmurs, rubs, nor gallops. No peripheral edema.  2+ peripheral pulses bilaterally. Abdomen: Bowel sounds normal.  No masses.  Non-tender without rebound.  Negative Murphy's sign. MSK: Grossly intact, no signs of weakness.  Full strength throughout upper and lower extremities.  Full ROM in upper and lower extremities.  No midline spinal tenderness. Neuro: Gait unremarkable, CN II-XII grossly intact.  C5-C6 Reflex 2/4 Bilaterally, L4 Reflex 2/4 Bilaterally.  Cerebellar function intact. Skin: No rashes. Psych: Alert and oriented to person/place/time.  Thought process normal. No anxiety/depression.  Assessment & Plan: Austin Powers was seen today for annual exam.  Diagnoses and all orders for this visit:  Annual physical exam -     Lipid panel -     CBC -     COMPLETE METABOLIC PANEL WITH GFR -     TSH  Healthy lifestyle interventions including but not limited to regular exercise, a healthy low fat diet, moderation of salt intake,  the dangers of tobacco/alcohol/recreational drug use, nutrition supplementation, and accident avoidance were discussed with the patient and a handout was provided for future reference.   Return if symptoms worsen or fail to improve.

## 2015-03-19 LAB — LIPID PANEL
CHOL/HDL RATIO: 3.2 ratio (ref <=5.0)
Cholesterol: 139 mg/dL (ref 125–200)
HDL: 43 mg/dL (ref >=40)
LDL Cholesterol: 83 mg/dL (ref <130)
Triglycerides: 63 mg/dL (ref <150)
VLDL: 13 mg/dL (ref <30)

## 2015-03-19 LAB — COMPLETE METABOLIC PANEL WITH GFR
ALBUMIN: 4.7 g/dL (ref 3.6–5.1)
ALK PHOS: 78 U/L (ref 40–115)
ALT: 16 U/L (ref 9–46)
AST: 18 U/L (ref 10–40)
BILIRUBIN TOTAL: 0.7 mg/dL (ref 0.2–1.2)
BUN: 14 mg/dL (ref 7–25)
CO2: 28 mmol/L (ref 20–31)
Calcium: 9.8 mg/dL (ref 8.6–10.3)
Chloride: 99 mmol/L (ref 98–110)
Creat: 0.88 mg/dL (ref 0.60–1.35)
GLUCOSE: 91 mg/dL (ref 65–99)
Potassium: 5.1 mmol/L (ref 3.5–5.3)
Sodium: 138 mmol/L (ref 135–146)
TOTAL PROTEIN: 7.5 g/dL (ref 6.1–8.1)

## 2015-03-19 LAB — CBC
HEMATOCRIT: 47.5 % (ref 39.0–52.0)
Hemoglobin: 16.3 g/dL (ref 13.0–17.0)
MCH: 30.5 pg (ref 26.0–34.0)
MCHC: 34.3 g/dL (ref 30.0–36.0)
MCV: 89 fL (ref 78.0–100.0)
MPV: 10.3 fL (ref 8.6–12.4)
PLATELETS: 294 10*3/uL (ref 150–400)
RBC: 5.34 MIL/uL (ref 4.22–5.81)
RDW: 12.8 % (ref 11.5–15.5)
WBC: 5.4 10*3/uL (ref 4.0–10.5)

## 2015-03-19 LAB — TSH: TSH: 2.02 mIU/L (ref 0.40–4.50)

## 2015-03-20 ENCOUNTER — Encounter: Payer: Self-pay | Admitting: Family Medicine

## 2015-03-20 NOTE — Telephone Encounter (Signed)
While giving pt results he expressed that he is much more fatigue during and this is usually for him.  Please advise.

## 2015-03-30 ENCOUNTER — Ambulatory Visit (INDEPENDENT_AMBULATORY_CARE_PROVIDER_SITE_OTHER): Payer: BLUE CROSS/BLUE SHIELD

## 2015-03-30 ENCOUNTER — Ambulatory Visit (INDEPENDENT_AMBULATORY_CARE_PROVIDER_SITE_OTHER): Payer: BLUE CROSS/BLUE SHIELD | Admitting: Sports Medicine

## 2015-03-30 ENCOUNTER — Encounter: Payer: Self-pay | Admitting: Sports Medicine

## 2015-03-30 DIAGNOSIS — M79671 Pain in right foot: Secondary | ICD-10-CM

## 2015-03-30 DIAGNOSIS — M79672 Pain in left foot: Principal | ICD-10-CM

## 2015-03-30 NOTE — Assessment & Plan Note (Addendum)
Previous 3/4 intermetatarsal bursitis has resolved with custom orthotics and metatarsal pad. Unfortunately having some pain on the lateral aspect of the foot with running, likely fifth metatarsal stress injury. Exam is benign today and I'm unable to find a focus of reproducible pain. Lateral heel wedge placed, avoid running for 2 weeks, x-rays, return to see me in 2 weeks, MRI if no better.

## 2015-03-30 NOTE — Progress Notes (Signed)
  Subjective:    CC: Right foot pain  HPI: We have treated marked in the past for intermetatarsal bursitis that resolved with custom orthotics and metatarsal pads, he is down to approximately 8-9 miles per week, and unfortunately has started to have pain that he localizes on the lateral aspect of the foot with running, just proximal and over the proximal tip of the fifth metatarsal. No trauma, pain is moderate, persistent without radiation, not present today.  Past medical history, Surgical history, Family history not pertinant except as noted below, Social history, Allergies, and medications have been entered into the medical record, reviewed, and no changes needed.   Review of Systems: No fevers, chills, night sweats, weight loss, chest pain, or shortness of breath.   Objective:    General: Well Developed, well nourished, and in no acute distress.  Neuro: Alert and oriented x3, extra-ocular muscles intact, sensation grossly intact.  HEENT: Normocephalic, atraumatic, pupils equal round reactive to light, neck supple, no masses, no lymphadenopathy, thyroid nonpalpable.  Skin: Warm and dry, no rashes. Cardiac: Regular rate and rhythm, no murmurs rubs or gallops, no lower extremity edema.  Respiratory: Clear to auscultation bilaterally. Not using accessory muscles, speaking in full sentences. Right Foot: No visible erythema or swelling. Range of motion is full in all directions. Strength is 5/5 in all directions. No hallux valgus. No pes cavus or pes planus. No abnormal callus noted. No pain over the navicular prominence, or base of fifth metatarsal. No tenderness to palpation of the calcaneal insertion of plantar fascia. No pain at the Achilles insertion. No pain over the calcaneal bursa. No pain of the retrocalcaneal bursa. No tenderness to palpation over the tarsals, metatarsals, or phalanges. No hallux rigidus or limitus. No tenderness palpation over interphalangeal joints. No  pain with compression of the metatarsal heads. Neurovascularly intact distally.  Impression and Recommendations:

## 2015-07-09 ENCOUNTER — Encounter: Payer: Self-pay | Admitting: Family Medicine

## 2015-07-10 ENCOUNTER — Encounter: Payer: Self-pay | Admitting: Sports Medicine

## 2015-07-10 ENCOUNTER — Ambulatory Visit (INDEPENDENT_AMBULATORY_CARE_PROVIDER_SITE_OTHER): Payer: BLUE CROSS/BLUE SHIELD | Admitting: Sports Medicine

## 2015-07-10 VITALS — BP 138/82 | HR 73 | Wt 151.0 lb

## 2015-07-10 DIAGNOSIS — M79672 Pain in left foot: Secondary | ICD-10-CM | POA: Diagnosis not present

## 2015-07-10 DIAGNOSIS — M7750 Other enthesopathy of unspecified foot: Secondary | ICD-10-CM

## 2015-07-10 DIAGNOSIS — M6588 Other synovitis and tenosynovitis, other site: Secondary | ICD-10-CM | POA: Diagnosis not present

## 2015-07-10 DIAGNOSIS — M79671 Pain in right foot: Secondary | ICD-10-CM

## 2015-07-10 NOTE — Assessment & Plan Note (Signed)
Extensor digitorum tendinitis with squeaker syndrome. Injected as above.

## 2015-07-10 NOTE — Progress Notes (Signed)
  Subjective:    CC: Left foot pain  HPI: After a long run on [employer] this pleasant 30 year old male runner has severe pain that he localizes on the dorsum of his left foot, over the fifth extensor digitorum tendon, with a palpable squeaking and grinding sensation when moving his little toe. Symptoms are severe, persistent with radiation up to the midfoot.  Past medical history, Surgical history, Family history not pertinant except as noted below, Social history, Allergies, and medications have been entered into the medical record, reviewed, and no changes needed.   Review of Systems: No fevers, chills, night sweats, weight loss, chest pain, or shortness of breath.   Objective:    General: Well Developed, well nourished, and in no acute distress.  Neuro: Alert and oriented x3, extra-ocular muscles intact, sensation grossly intact.  HEENT: Normocephalic, atraumatic, pupils equal round reactive to light, neck supple, no masses, no lymphadenopathy, thyroid nonpalpable.  Skin: Warm and dry, no rashes. Cardiac: Regular rate and rhythm, no murmurs rubs or gallops, no lower extremity edema.  Respiratory: Clear to auscultation bilaterally. Not using accessory muscles, speaking in full sentences. Left Foot: Visibly swollen extensor digitorum tendon sheath, fifth Range of motion is full in all directions. Strength is 5/5 in all directions. No hallux valgus. No pes cavus or pes planus. No abnormal callus noted. No pain over the navicular prominence, or base of fifth metatarsal. No tenderness to palpation of the calcaneal insertion of plantar fascia. No pain at the Achilles insertion. No pain over the calcaneal bursa. No pain of the retrocalcaneal bursa. No tenderness to palpation over the tarsals, metatarsals, or phalanges. No hallux rigidus or limitus. No tenderness palpation over interphalangeal joints. No pain with compression of the metatarsal heads. Neurovascularly intact  distally. Palpable grinding/squeaking sensation as the fifth digit is taken through the range of motion.  Procedure: Real-time Ultrasound Guided Injection of left fifth extensor digitorum tendon sheath Device: GE Logiq E  Verbal informed consent obtained.  Time-out conducted.  Noted no overlying erythema, induration, or other signs of local infection.  Skin prepped in a sterile fashion.  Local anesthesia: Topical Ethyl chloride.  With sterile technique and under real time ultrasound guidance:  Injection around the tendon sheath is above, 1 mL lidocaine, 1 mL kenalog 40. Completed without difficulty  Pain immediately resolved suggesting accurate placement of the medication.  Advised to call if fevers/chills, erythema, induration, drainage, or persistent bleeding.  Images permanently stored and available for review in the ultrasound unit.  Impression: Technically successful ultrasound guided injection.  The foot was then strapped with compressive dressing.  Impression and Recommendations:

## 2015-08-06 ENCOUNTER — Ambulatory Visit: Payer: BLUE CROSS/BLUE SHIELD | Admitting: Sports Medicine

## 2015-10-19 ENCOUNTER — Ambulatory Visit (INDEPENDENT_AMBULATORY_CARE_PROVIDER_SITE_OTHER): Payer: BLUE CROSS/BLUE SHIELD | Admitting: Osteopathic Medicine

## 2015-10-19 ENCOUNTER — Encounter: Payer: Self-pay | Admitting: Osteopathic Medicine

## 2015-10-19 VITALS — BP 153/97 | HR 70 | Temp 98.1°F | Ht 67.0 in | Wt 154.0 lb

## 2015-10-19 DIAGNOSIS — R21 Rash and other nonspecific skin eruption: Secondary | ICD-10-CM | POA: Diagnosis not present

## 2015-10-19 DIAGNOSIS — H6981 Other specified disorders of Eustachian tube, right ear: Secondary | ICD-10-CM

## 2015-10-19 NOTE — Progress Notes (Signed)
HPI: Austin Powers is a 30 y.o. male  who presents to [facility] today, 10/19/15,  for chief complaint of:  Chief Complaint  Patient presents with  . Ear Pain    RIGHT    Ear . Context: No injury, recently was swimming last week . Location: Right ear . Quality: Feels like lump of your waxes in the right ear, feels like hearing things underwater a bit . Assoc signs/symptoms: No fever, no sore throat, no other upper respiratory symptoms other than sinus congestion, occasionally feels low into dizziness when turning head  Skin . Location: L leg . Quality: itching, about 0.75 cm diameter red mildly scaled lesion . Duration: Few weeks      Past medical, surgical, social and family history reviewed: Past Medical History:  Diagnosis Date  . Asthma   . Seasonal allergies    No past surgical history on file. Social History  Substance Use Topics  . Smoking status: Never Smoker  . Smokeless tobacco: Not on file  . Alcohol use Yes   No family history on file.   Current medication list and allergy/intolerance information reviewed:   Current Outpatient Prescriptions  Medication Sig Dispense Refill  . cetirizine (ZYRTEC) 10 MG tablet Take 10 mg by mouth daily.    . Cholecalciferol (VITAMIN D-3 PO) Take 2,000 Int'l Units by mouth.    Marland Kitchen CINNAMON PO Take 2,000 mg by mouth.    Ernestine Conrad 3-6-9 Fatty Acids (OMEGA 3-6-9 PO) Take 900 mg by mouth.    . ranitidine (ZANTAC) 150 MG capsule Take 150 mg by mouth 1 day or 1 dose.    . vitamin C (ASCORBIC ACID) 500 MG tablet Take 500 mg by mouth 2 (two) times daily.     No current facility-administered medications for this visit.    Allergies  Allergen Reactions  . Peanut-Containing Drug Products     Pt never been DX.       Review of Systems:  Constitutional:  No  fever, no chills, No recent illness, No unintentional weight changes. No significant fatigue.   HEENT: No  headache, no vision change, no  hearing change, No sore throat, +sinus pressure, +ear symptoms as per HPI  Cardiac: No  chest pain, No  pressure  Respiratory:  No  shortness of breath. No  Cough  Gastrointestinal: No  abdominal pain, No  nausea, n   Musculoskeletal: No new myalgia/arthralgia  Skin: No  Rash,  Neurologic: No  weakness, +dizziness,    Exam:  BP (!) 153/97   Pulse 70   Temp 98.1 F (36.7 C) (Oral)   Ht 5\' 7"  (1.702 m)   Wt 154 lb (69.9 kg)   BMI 24.12 kg/m   Constitutional: VS see above. General Appearance: alert, well-developed, well-nourished, NAD  Eyes: Normal lids and conjunctive, non-icteric sclera  Ears, Nose, Mouth, Throat: MMM, Normal external inspection ears/nares/mouth/lips/gums. TM normal bilaterally with scant clear effusion bilaterally. Pharynx/tonsils no erythema, no exudate. Nasal mucosa normal.   Neck: No masses, trachea midline. No thyroid enlargement. No tenderness/mass appreciated. No lymphadenopathy  Respiratory: Normal respiratory effort. no wheeze, no rhonchi, no rales  Cardiovascular: S1/S2 normal, no murmur, no rub/gallop auscultated. RRR.   Neurological: Normal balance/coordination. No tremor. Dix-Hallpike negative bilaterally, no nystagmus or reproduction of symptoms  Skin: warm, dry, intact. Small erythematous scaly lesion consistent with most likely mild ringworm  Psychiatric: Normal judgment/insight. Normal mood and affect. Oriented x3.     ASSESSMENT/PLAN:   Dysfunction of  right eustachian tube   Patient Instructions  Try Flonase or Azelastine nasal spray +/- Claritin +/- Sudafed. If no improvement in 3 - 5 days, please call us and we can send referral to ENT.   For rash, can try Terbinafine cream antifungal (look in athlete's foot remedies, cram may say os for feet but you can use it wherever). If skin no better in one month, or if worse before then, come back and we may need to do a scraping or biopsy.      Visit summary with medication list and  pertinent instructions was printed for patient to review. All questions at time of visit were answered - patient instructed to contact office with any additional concerns. ER/RTC precautions were reviewed with the patient. Follow-up plan: Return for [place] when due, sooner if needed.  Note: Total time spent 25 minutes, greater than 50% of the visit was spent face-to-face counseling and coordinating care for the following: The encounter diagnosis was Dysfunction of right eustachian tube.Marland Kitchen

## 2015-10-19 NOTE — Patient Instructions (Signed)
Try Flonase or Azelastine nasal spray +/- Claritin +/- Sudafed. If no improvement in 3 - 5 days, please call us and we can send referral to ENT.   For rash, can try Terbinafine cream antifungal (look in athlete's foot remedies, cram may say os for feet but you can use it wherever). If skin no better in one month, or if worse before then, come back and we may need to do a scraping or biopsy.

## 2015-10-20 DIAGNOSIS — H6981 Other specified disorders of Eustachian tube, right ear: Secondary | ICD-10-CM | POA: Insufficient documentation

## 2015-11-09 ENCOUNTER — Encounter: Payer: BLUE CROSS/BLUE SHIELD | Admitting: Osteopathic Medicine

## 2015-11-13 ENCOUNTER — Encounter: Payer: Self-pay | Admitting: Family Medicine

## 2015-11-13 ENCOUNTER — Ambulatory Visit (INDEPENDENT_AMBULATORY_CARE_PROVIDER_SITE_OTHER): Payer: BLUE CROSS/BLUE SHIELD | Admitting: Family Medicine

## 2015-11-13 VITALS — BP 123/77 | HR 88 | Ht 67.0 in | Wt 156.0 lb

## 2015-11-13 DIAGNOSIS — L301 Dyshidrosis [pompholyx]: Secondary | ICD-10-CM | POA: Diagnosis not present

## 2015-11-13 MED ORDER — CLOBETASOL PROPIONATE 0.05 % EX CREA: 1.0000 "application " | TOPICAL_CREAM | Freq: Two times a day (BID) | CUTANEOUS | 0 refills | Status: DC

## 2015-11-13 NOTE — Progress Notes (Signed)
   Subjective:    Patient ID: Austin Powers, male    DOB: Jun 07, 1985, 30 y.o.   MRN: [id number]  HPI R foot x 4 wks pr reports that this happens to him yearly he has tried aquaphor, claritin, and an antifungal for 3 days which he feels caused his sxs to get worse. he has a hx of athelets foot. he does report that this has spread to his L foor but its not as bad. Coming and going for about 7 years.    Review of Systems     Objective:   Physical Exam  Constitutional: He is oriented to person, place, and time. He appears well-developed and well-nourished.  HENT:  Head: Normocephalic and atraumatic.  Eyes: Conjunctivae and EOM are normal.  Cardiovascular: Normal rate.   Pulmonary/Chest: Effort normal.  Neurological: He is alert and oriented to person, place, and time.  Skin: Skin is dry. No pallor.   On the top of right foot the skin is pink with some scale around the edge of the rash.  Over most of distal foot. Some white colored skin without maceration between the toes.  He has some small vesicles between toes.    Psychiatric: He has a normal mood and affect. His behavior is normal.  Vitals reviewed.         Assessment & Plan:  Most consistent with dyshidrotic eczema-though I did send a skin scraping to rule out fungal elements. Will treat with topical clobetasol. If not significantly better after 5-7 days and please let us know.

## 2015-11-16 LAB — FUNGAL STAIN

## 2016-03-17 ENCOUNTER — Encounter: Payer: Self-pay | Admitting: Osteopathic Medicine

## 2016-03-17 ENCOUNTER — Ambulatory Visit (INDEPENDENT_AMBULATORY_CARE_PROVIDER_SITE_OTHER): Payer: BLUE CROSS/BLUE SHIELD | Admitting: Osteopathic Medicine

## 2016-03-17 VITALS — BP 132/91 | HR 74 | Ht 67.0 in | Wt 158.0 lb

## 2016-03-17 DIAGNOSIS — J302 Other seasonal allergic rhinitis: Secondary | ICD-10-CM

## 2016-03-17 DIAGNOSIS — R030 Elevated blood-pressure reading, without diagnosis of hypertension: Secondary | ICD-10-CM

## 2016-03-17 DIAGNOSIS — R5383 Other fatigue: Secondary | ICD-10-CM | POA: Diagnosis not present

## 2016-03-17 DIAGNOSIS — Z8639 Personal history of other endocrine, nutritional and metabolic disease: Secondary | ICD-10-CM | POA: Diagnosis not present

## 2016-03-17 DIAGNOSIS — Z0000 Encounter for general adult medical examination without abnormal findings: Secondary | ICD-10-CM

## 2016-03-17 LAB — CBC WITH DIFFERENTIAL/PLATELET
BASOS PCT: 0 %
Basophils Absolute: 0 cells/uL (ref 0–200)
EOS PCT: 4 %
Eosinophils Absolute: 184 cells/uL (ref 15–500)
HEMATOCRIT: 46.4 % (ref 38.5–50.0)
HEMOGLOBIN: 15.7 g/dL (ref 13.2–17.1)
LYMPHS ABS: 1426 {cells}/uL (ref 850–3900)
LYMPHS PCT: 31 %
MCH: 30.3 pg (ref 27.0–33.0)
MCHC: 33.8 g/dL (ref 32.0–36.0)
MCV: 89.6 fL (ref 80.0–100.0)
MONO ABS: 368 {cells}/uL (ref 200–950)
MPV: 10.1 fL (ref 7.5–12.5)
Monocytes Relative: 8 %
Neutro Abs: 2622 cells/uL (ref 1500–7800)
Neutrophils Relative %: 57 %
Platelets: 268 10*3/uL (ref 140–400)
RBC: 5.18 MIL/uL (ref 4.20–5.80)
RDW: 13.1 % (ref 11.0–15.0)
WBC: 4.6 10*3/uL (ref 3.8–10.8)

## 2016-03-17 LAB — MAGNESIUM: Magnesium: 1.9 mg/dL (ref 1.5–2.5)

## 2016-03-17 LAB — COMPLETE METABOLIC PANEL WITH GFR
ALK PHOS: 80 U/L (ref 40–115)
ALT: 21 U/L (ref 9–46)
AST: 20 U/L (ref 10–40)
Albumin: 4.7 g/dL (ref 3.6–5.1)
BILIRUBIN TOTAL: 0.8 mg/dL (ref 0.2–1.2)
BUN: 15 mg/dL (ref 7–25)
CALCIUM: 9.7 mg/dL (ref 8.6–10.3)
CO2: 26 mmol/L (ref 20–31)
CREATININE: 0.94 mg/dL (ref 0.60–1.35)
Chloride: 102 mmol/L (ref 98–110)
Glucose, Bld: 95 mg/dL (ref 65–99)
Potassium: 4.4 mmol/L (ref 3.5–5.3)
Sodium: 140 mmol/L (ref 135–146)
TOTAL PROTEIN: 7.6 g/dL (ref 6.1–8.1)

## 2016-03-17 LAB — FERRITIN: Ferritin: 113 ng/mL (ref 20–345)

## 2016-03-17 LAB — LIPID PANEL
CHOLESTEROL: 157 mg/dL (ref <200)
HDL: 45 mg/dL (ref >40)
LDL CALC: 98 mg/dL (ref <100)
TRIGLYCERIDES: 69 mg/dL (ref <150)
Total CHOL/HDL Ratio: 3.5 Ratio (ref <5.0)
VLDL: 14 mg/dL (ref <30)

## 2016-03-17 LAB — TSH: TSH: 1.61 m[IU]/L (ref 0.40–4.50)

## 2016-03-17 LAB — IRON AND TIBC
%SAT: 26 % (ref 15–60)
Iron: 87 ug/dL (ref 50–180)
TIBC: 337 ug/dL (ref 250–425)
UIBC: 250 ug/dL (ref 125–400)

## 2016-03-17 LAB — VITAMIN B12: VITAMIN B 12: 778 pg/mL (ref 200–1100)

## 2016-03-17 NOTE — Patient Instructions (Signed)
Claritin, Zyrtec or Allegra +/- nasal spray +/- Sudafed

## 2016-03-17 NOTE — Progress Notes (Signed)
HPI: Austin Powers is a 31 y.o. male  who presents to [facility] today, 03/17/16,  for chief complaint of:  Chief Complaint  Patient presents with  . Annual Exam    Patient here for annual physical. No complaints today.  Brief review of recent medical issues:  Blood pressure bit elevated today, history of whitecoat hypertension  Eustachian tube dysfunction on the right and seasonal allergies have gotten a bit better. Patient is not sure that Claritin is still working for him. Has some questions about possible alternative.  Patient frequently runs for exercise, he would like to be checked for some nutrient deficiencies or excesses since he is taking vitamin D and magnesium. Occasional fatigue issues, would like to have B12 evaluated as well as iron.   Avoids gluten, daughter has celiac. Patient takes he may also have celiac, (+) TTG testing, no colonoscopy. Rare loose stools/abdominal discomfort, does well as long as he avoids gluten   Past medical, surgical, social and family history reviewed: Patient Active Problem List   Diagnosis Date Noted  . Iliotibial band syndrome of left side 06/17/2014  . Left extensor digitorum longus tendinitis of the foot 04/16/2014  . Elevated anti-tissue transglutaminase (tTG) IgA level 06/19/2013  . Dyslipidemia 06/14/2013  . Eczema 06/14/2013  . History of urinary incontinence (PVR Normal 5/12) 11/16/2011  . History of eczema 11/16/2011  . Right Epididymal cyst 11/16/2011  . Seasonal allergies 11/09/2011  . Elevated blood-pressure reading without diagnosis of hypertension 11/09/2011  . Dysphagia 11/09/2011   History reviewed. No pertinent surgical history. Social History  Substance Use Topics  . Smoking status: Never Smoker  . Smokeless tobacco: Never Used  . Alcohol use Yes   Family History  Problem Relation Age of Onset  . Cholelithiasis Mother   . Prostate cancer Maternal Grandfather      Current  medication list and allergy/intolerance information reviewed:   Current Outpatient Prescriptions  Medication Sig Dispense Refill  . cetirizine (ZYRTEC) 10 MG tablet Take 10 mg by mouth daily.    . Cholecalciferol (VITAMIN D-3 PO) Take 2,000 Int'l Units by mouth.    Marland Kitchen CINNAMON PO Take 2,000 mg by mouth.    . clobetasol cream (TEMOVATE) 9.39 % Apply 1 application topically 2 (two) times daily. 30 g 0  . Fluticasone Propionate, Inhal, 100 MCG/BLIST AEPB Inhale 100 mcg into the lungs as needed. Patient takes 2 sprays as needed    . Magnesium 250 MG TABS Take 250 mg by mouth daily. Patient takes 1-2 tabs daily    . Omega 3-6-9 Fatty Acids (OMEGA 3-6-9 PO) Take 900 mg by mouth.    . ranitidine (ZANTAC) 150 MG capsule Take 150 mg by mouth 1 day or 1 dose.    . vitamin C (ASCORBIC ACID) 500 MG tablet Take 500 mg by mouth 2 (two) times daily.     No current facility-administered medications for this visit.    Allergies  Allergen Reactions  . Peanut-Containing Drug Products     Pt never been DX.       Review of Systems:  Constitutional:  No  fever, no chills, No recent illness, No unintentional weight changes.   HEENT: No  headache, no vision change, no hearing change, No sore throat, No  sinus pressure  Cardiac: No  chest pain, No  pressure, No palpitations, No exercise intolerance   Respiratory:  No  shortness of breath. No  Cough  Gastrointestinal: No  abdominal pain, No  nausea, No  vomiting,  No  blood in stool,  No  constipation   Musculoskeletal: No new myalgia/arthralgia  Genitourinary: No  incontinence, No  abnormal genital bleeding, No abnormal genital discharge  Skin: No  Rash, No other wounds/concerning lesions  Hem/Onc: No  easy bruising/bleeding, No  abnormal lymph node  Endocrine: No cold intolerance,  No heat intolerance. No polyuria/polydipsia/polyphagia   Neurologic: No  weakness, No  dizziness, No  slurred speech/focal weakness/facial droop  Psychiatric: No   concerns with depression, No  concerns with anxiety, No sleep problems, No mood problems  Exam:  BP (!) 132/91   Pulse 74   Ht 5\' 7"  (1.702 m)   Wt 158 lb (71.7 kg)   BMI 24.75 kg/m   Constitutional: VS see above. General Appearance: alert, well-developed, well-nourished, NAD  Eyes: Normal lids and conjunctive, non-icteric sclera  Ears, Nose, Mouth, Throat: MMM, Normal external inspection ears/nares/mouth/lips/gums. TM normal bilaterally. Pharynx/tonsils no erythema, no exudate. Nasal mucosa normal.   Neck: No masses, trachea midline. No thyroid enlargement. No tenderness/mass appreciated. No lymphadenopathy  Respiratory: Normal respiratory effort. no wheeze, no rhonchi, no rales  Cardiovascular: S1/S2 normal, no murmur, no rub/gallop auscultated. RRR. No lower extremity edema.  Gastrointestinal: Nontender, no masses. No hepatomegaly, no splenomegaly. No hernia appreciated. Bowel sounds normal. Rectal exam deferred.   Musculoskeletal: Gait normal. No clubbing/cyanosis of digits.   Neurological: Normal balance/coordination. No tremor. No cranial nerve deficit on limited exam. Motor and sensation intact and symmetric. Cerebellar reflexes intact.   Skin: warm, dry, intact. No rash/ulcer. No concerning nevi or subq nodules on limited exam.    Psychiatric: Normal judgment/insight. Normal mood and affect. Oriented x3.      ASSESSMENT/PLAN:   Annual physical exam - Plan: CBC with Differential/Platelet, Lipid panel, COMPLETE METABOLIC PANEL WITH GFR, TSH, VITAMIN D 25 Hydroxy (Vit-D Deficiency, Fractures)  History of iron deficiency - Plan: Ferritin, Iron and TIBC  Other fatigue - Plan: TSH, VITAMIN D 25 Hydroxy (Vit-D Deficiency, Fractures), Vitamin B12, Magnesium  Chronic seasonal allergic rhinitis due to other allergen    MALE PREVENTIVE CARE  updated 03/17/16  ANNUAL SCREENING/COUNSELING  Any changes to health in the past year? no  Diet/Exercise - HEALTHY HABITS  DISCUSSED TO DECREASE CV RISK History  Smoking Status  . Never Smoker  Smokeless Tobacco  . Never Used   History  Alcohol Use  . Yes   Depression screen PHQ 2/9 03/17/2016  Decreased Interest 1  Down, Depressed, Hopeless 1  PHQ - 2 Score 2  Altered sleeping 0  Tired, decreased energy 0  Change in appetite 0  Feeling bad or failure about yourself  0  Trouble concentrating 0  Moving slowly or fidgety/restless 0  Suicidal thoughts 0  PHQ-9 Score 2  Difficult doing work/chores Not difficult at all    [place]  Sexually active in the past year? - Yes with male.  STI testing needed/desired today? - no  Any concerns with testosterone/libido? - no  INFECTIOUS DISEASE SCREENING  HIV - does not need   GC/CT - does not need  HepC - does not need  TB - does not need  CANCER SCREENING  Lung - USPSTF: 55-80yo w/ 30 py hx unless quit w/in 66yr - does not need  Colon - does not need - screen age 95  Prostate - does not need  OTHER DISEASE SCREENING  Lipid - needs  DM2 - needs  AAA - 65-75yo ever smoked: does not need  Osteoporosis - men 33yo+ - does not need  ADULT VACCINATION  Influenza - annual vaccine recommended  Td - booster every 10 years   Zoster - option at 86, yes at 60+   PCV13 - was not indicated  PPSV23 - was not indicated Immunization History  Administered Date(s) Administered  . Influenza-Unspecified 10/17/2013, 09/11/2014, 11/11/2015  . Tdap 11/17/2012         Patient Instructions  Claritin, Zyrtec or Allegra +/- nasal spray +/- Sudafed     Visit summary with medication list and pertinent instructions was printed for patient to review. All questions at time of visit were answered - patient instructed to contact office with any additional concerns. ER/RTC precautions were reviewed with the patient. Follow-up plan: Return in about 1 year (around 03/17/2017) for [place], sooner if needed .

## 2016-03-18 LAB — VITAMIN D 25 HYDROXY (VIT D DEFICIENCY, FRACTURES): VIT D 25 HYDROXY: 62 ng/mL (ref 30–100)

## 2016-05-10 IMAGING — CR DG FOOT COMPLETE 3+V*R*
3 series · 3 of 3 positions shown · non-contrast
Comparison: None.

CLINICAL DATA: Fifth metatarsal pain with no known injury, initial
encounter

EXAM:
RIGHT FOOT COMPLETE - 3+ VIEW

[foot ap]
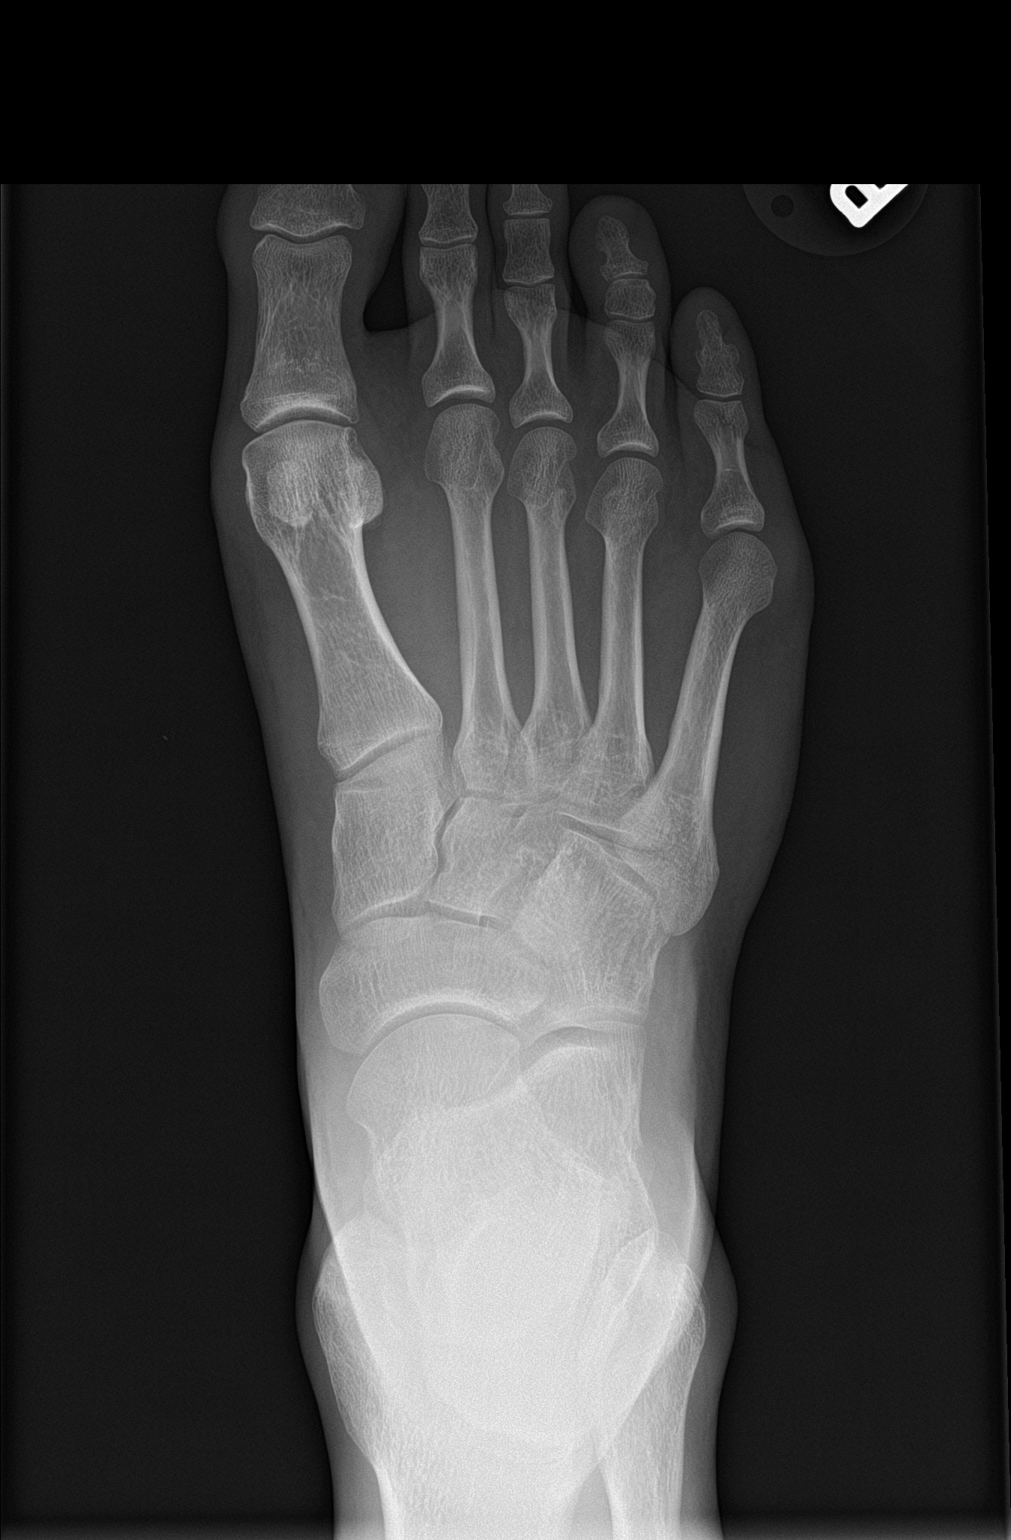

[foot obl]
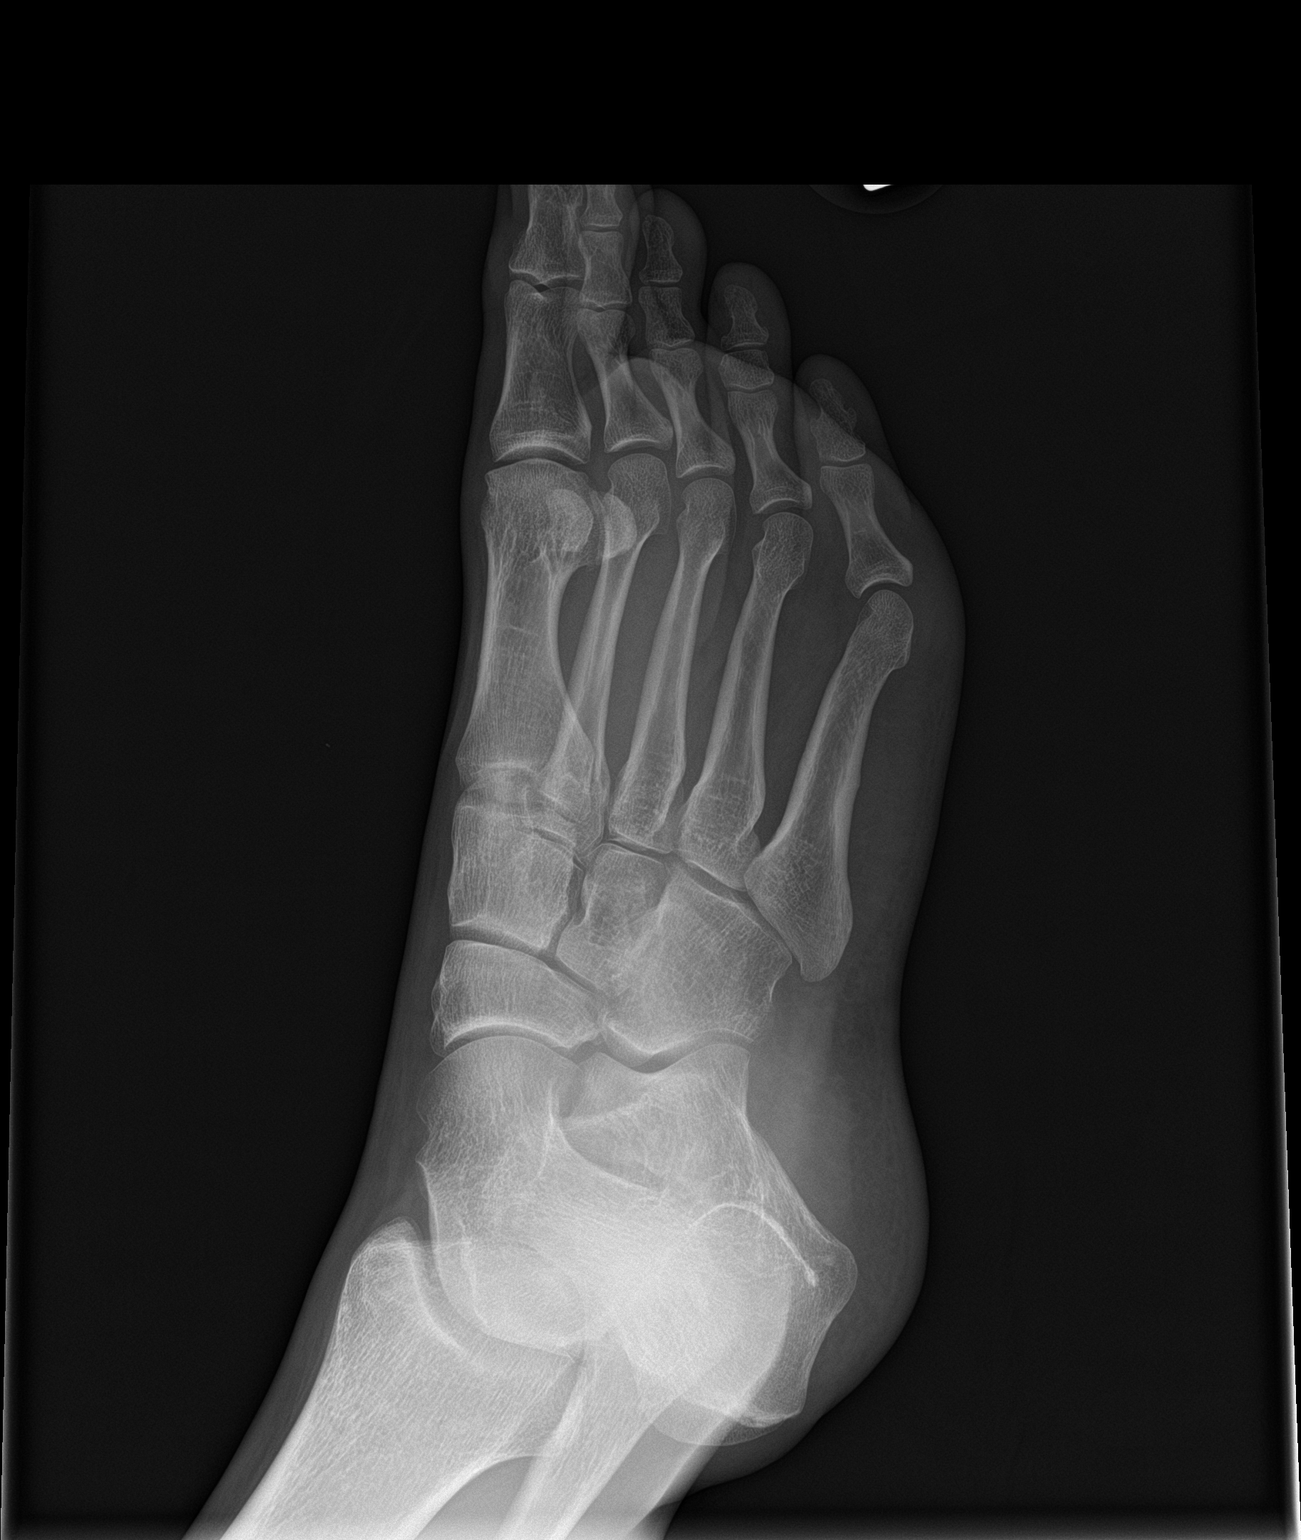

[foot lat]
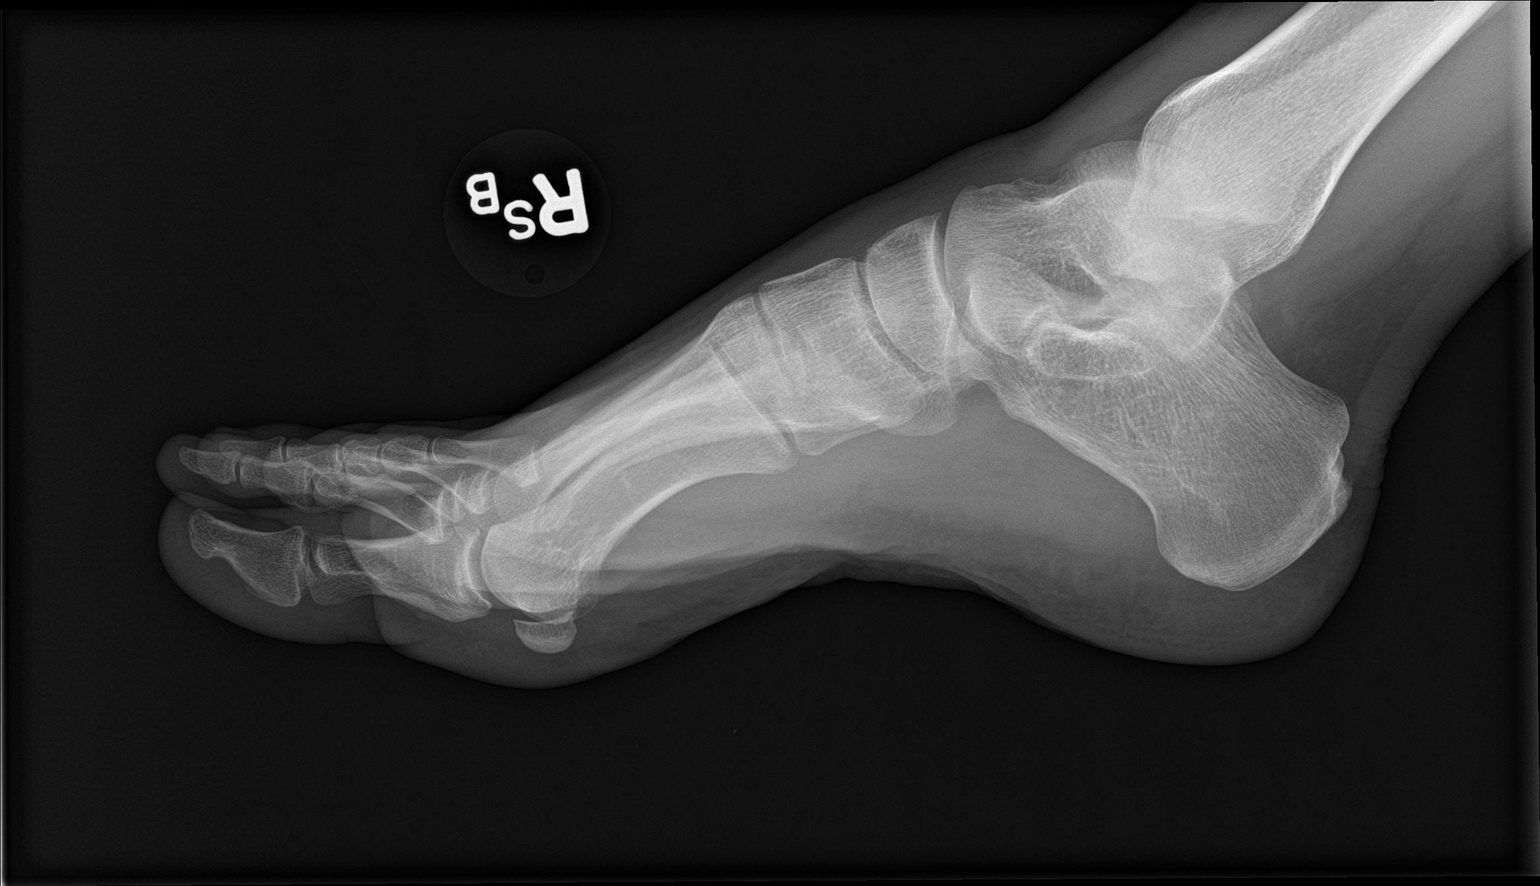

[3 of 3 positions shown; findings below may reference images not displayed]

FINDINGS: There is no evidence of fracture or dislocation. There is no
evidence of arthropathy or other focal bone abnormality. Soft
tissues are unremarkable.
IMPRESSION: No acute abnormality noted.

## 2016-10-11 ENCOUNTER — Encounter: Payer: Self-pay | Admitting: Osteopathic Medicine

## 2016-10-11 ENCOUNTER — Ambulatory Visit (INDEPENDENT_AMBULATORY_CARE_PROVIDER_SITE_OTHER): Payer: BLUE CROSS/BLUE SHIELD | Admitting: Osteopathic Medicine

## 2016-10-11 VITALS — BP 135/98 | HR 93 | Ht 67.0 in | Wt 164.0 lb

## 2016-10-11 DIAGNOSIS — F411 Generalized anxiety disorder: Secondary | ICD-10-CM | POA: Diagnosis not present

## 2016-10-11 MED ORDER — ESCITALOPRAM OXALATE 10 MG PO TABS: 10.0000 mg | ORAL_TABLET | Freq: Every day | ORAL | 1 refills | Status: DC

## 2016-10-11 NOTE — Patient Instructions (Signed)
We are starting a medication today called Lexapro to help treat your anxiety. This is a daily medication to help control your symptoms. I also highly encourage my patients who are suffering from anxiety to seek care with a counselor or therapist. A therapist can coach you in techniques to recognize and deal with troubling thought patterns and behaviors. The ability to cope with external stressors is crucial to overall mental health.   I will call messare you in the next 2 weeks: If doing well on the medicine but not feeling any effect, we can increase the dose. If you're starting to feel some effect/improvement, we can hold off on a dose increase and reevaluate at your office visit.   Let's plan to follow up in the office in 4 weeks. At that time, we can talk about how well the medicine is working for you, and we can consider increasing the dose, adding another medicine, etc.   If you experience problematic side effects, please let me know ASAP - we can switch the medicine any time, and we don't need an appointment for this.   Any questions or concerns, call me!

## 2016-10-11 NOTE — Progress Notes (Signed)
HPI: Austin Powers is a 31 y.o. male  who presents to [facility] today, 10/11/16,  for chief complaint of:  Chief Complaint  Patient presents with  . Anxiety     Been feeling down alot lately    Patient reports ongoing anxiety on and off for several years and starting to affect quality of life. Constantly worrying about worst-case scenarios. Recently feeling fatigue and heaviness in arms, recently experienced some some heartburn issues after eating citrus fruits which led him to be concerned about heart attack. He is an otherwise healthy 31 year old with no family history of significant cardiac disease. Heartburn symptoms have resolved at this point.   Past medical, surgical, social and family history reviewed: Patient Active Problem List   Diagnosis Date Noted  . History of iron deficiency 03/17/2016  . Iliotibial band syndrome of left side 06/17/2014  . Left extensor digitorum longus tendinitis of the foot 04/16/2014  . Elevated anti-tissue transglutaminase (tTG) IgA level 06/19/2013  . Dyslipidemia 06/14/2013  . Eczema 06/14/2013  . History of urinary incontinence (PVR Normal 5/12) 11/16/2011  . History of eczema 11/16/2011  . Right Epididymal cyst 11/16/2011  . Seasonal allergies 11/09/2011  . Elevated blood-pressure reading without diagnosis of hypertension 11/09/2011  . Dysphagia 11/09/2011   History reviewed. No pertinent surgical history.   Social History  Substance Use Topics  . Smoking status: Never Smoker  . Smokeless tobacco: Never Used  . Alcohol use Yes   Family History  Problem Relation Age of Onset  . Cholelithiasis Mother   . Prostate cancer Maternal Grandfather      Current medication list and allergy/intolerance information reviewed:   Current Outpatient Prescriptions  Medication Sig Dispense Refill  . cetirizine (ZYRTEC) 10 MG tablet Take 10 mg by mouth daily.    . Cholecalciferol (VITAMIN D-3 PO) Take 2,000 Int'l  Units by mouth.    Marland Kitchen CINNAMON PO Take 2,000 mg by mouth.    . clobetasol cream (TEMOVATE) 9.52 % Apply 1 application topically 2 (two) times daily. 30 g 0  . Fluticasone Propionate, Inhal, 100 MCG/BLIST AEPB Inhale 100 mcg into the lungs as needed. Patient takes 2 sprays as needed    . Magnesium 250 MG TABS Take 250 mg by mouth daily. Patient takes 1-2 tabs daily    . Omega 3-6-9 Fatty Acids (OMEGA 3-6-9 PO) Take 900 mg by mouth.    . ranitidine (ZANTAC) 150 MG capsule Take 150 mg by mouth 1 day or 1 dose.    . vitamin C (ASCORBIC ACID) 500 MG tablet Take 500 mg by mouth 2 (two) times daily.     No current facility-administered medications for this visit.    Allergies  Allergen Reactions  . Peanut-Containing Drug Products     Pt never been DX.       Review of Systems:  Constitutional:  No  fever, no chills,No night sweats  No recent illness, No unintentional weight changes. +significant fatigue.   HEENT: +occasoinal headache, no vision change, no hearing change  Cardiac: No  chest pain, No  pressure, No palpitations  Respiratory:  No  shortness of breath. No  Cough  Gastrointestinal: No  abdominal pain, No  nausea, No  vomiting  Musculoskeletal: No new myalgia/arthralgia  Skin: No  Rash  Psychiatric: +concerns with depression, +concerns with anxiety, No sleep problems, No mood problems  Exam:  BP (!) 135/98   Pulse 93   Ht 5\' 7"  (1.702 m)   Wt  164 lb (74.4 kg)   BMI 25.69 kg/m   Constitutional: VS see above. General Appearance: alert, well-developed, well-nourished, NAD  Eyes: Normal lids and conjunctive, non-icteric sclera  Ears, Nose, Mouth, Throat: MMM, Normal external inspection ears/nares/mouth/lips/gums.   Neck: No masses, trachea midline. No thyroid enlargement. No tenderness/mass appreciated. No lymphadenopathy  Respiratory: Normal respiratory effort. no wheeze, no rhonchi, no rales  Cardiovascular: S1/S2 normal, no murmur, no rub/gallop auscultated.  RRR.  Musculoskeletal: Gait normal. No clubbing/cyanosis of digits.   Neurological: Normal balance/coordination.   Skin: warm, dry, intact.   Psychiatric: Normal judgment/insight. Normal mood and affect. Oriented x3.   Depression screen [facility] 2/9 10/11/2016 03/17/2016  Decreased Interest 1 1  Down, Depressed, Hopeless 1 1  PHQ - 2 Score 2 2  Altered sleeping 1 0  Tired, decreased energy 2 0  Change in appetite 1 0  Feeling bad or failure about yourself  1 0  Trouble concentrating 0 0  Moving slowly or fidgety/restless 0 0  Suicidal thoughts - 0  PHQ-9 Score 7 2  Difficult doing work/chores - Not difficult at all       ASSESSMENT/PLAN: Go ahead and start the citalopram. Patient would like to avoid using as needed sedatives for anxiety/panic and I would agree at this point that Mrs. Sarai as first-line therapy is appropriate. We'll go ahead and get labs as well to rule out other organic issue.  Generalized anxiety disorder - Plan: CBC with Differential/Platelet, COMPLETE METABOLIC PANEL WITH GFR, TSH, VITAMIN D 25 Hydroxy (Vit-D Deficiency, Fractures), Magnesium, escitalopram (LEXAPRO) 10 MG tablet    Patient Instructions  We are starting a medication today called Lexapro to help treat your anxiety. This is a daily medication to help control your symptoms. I also highly encourage my patients who are suffering from anxiety to seek care with a counselor or therapist. A therapist can coach you in techniques to recognize and deal with troubling thought patterns and behaviors. The ability to cope with external stressors is crucial to overall mental health.   I will call messare you in the next 2 weeks: If doing well on the medicine but not feeling any effect, we can increase the dose. If you're starting to feel some effect/improvement, we can hold off on a dose increase and reevaluate at your office visit.   Let's plan to follow up in the office in 4 weeks. At that time, we can talk about  how well the medicine is working for you, and we can consider increasing the dose, adding another medicine, etc.   If you experience problematic side effects, please let me know ASAP - we can switch the medicine any time, and we don't need an appointment for this.   Any questions or concerns, call me!      Visit summary with medication list and pertinent instructions was printed for patient to review. All questions at time of visit were answered - patient instructed to contact office with any additional concerns. ER/RTC precautions were reviewed with the patient. Follow-up plan: Return in about 4 weeks (around 11/08/2016) for recheck anxiety on new meds .  Note: Total time spent 25 minutes, greater than 50% of the visit was spent face-to-face counseling and coordinating care for the following: The encounter diagnosis was Generalized anxiety disorder.Marland Kitchen

## 2016-10-11 NOTE — Addendum Note (Signed)
Addended by: Doree Albee on: 10/11/2016 03:16 PM   Modules accepted: Orders

## 2016-10-12 LAB — CBC WITH DIFFERENTIAL/PLATELET
BASOS PCT: 0.6 %
Basophils Absolute: 31 cells/uL (ref 0–200)
EOS PCT: 1.6 %
Eosinophils Absolute: 82 cells/uL (ref 15–500)
HCT: 48 % (ref 38.5–50.0)
Hemoglobin: 16.3 g/dL (ref 13.2–17.1)
Lymphs Abs: 1193 cells/uL (ref 850–3900)
MCH: 30.3 pg (ref 27.0–33.0)
MCHC: 34 g/dL (ref 32.0–36.0)
MCV: 89.2 fL (ref 80.0–100.0)
MPV: 10.1 fL (ref 7.5–12.5)
Monocytes Relative: 7.4 %
NEUTROS PCT: 67 %
Neutro Abs: 3417 cells/uL (ref 1500–7800)
PLATELETS: 284 10*3/uL (ref 140–400)
RBC: 5.38 10*6/uL (ref 4.20–5.80)
RDW: 12.2 % (ref 11.0–15.0)
TOTAL LYMPHOCYTE: 23.4 %
WBC: 5.1 10*3/uL (ref 3.8–10.8)
WBCMIX: 377 {cells}/uL (ref 200–950)

## 2016-10-12 LAB — VITAMIN D 25 HYDROXY (VIT D DEFICIENCY, FRACTURES): VIT D 25 HYDROXY: 46 ng/mL (ref 30–100)

## 2016-10-12 LAB — COMPLETE METABOLIC PANEL WITH GFR
AG RATIO: 1.9 (calc) (ref 1.0–2.5)
ALT: 24 U/L (ref 9–46)
AST: 20 U/L (ref 10–40)
Albumin: 4.9 g/dL (ref 3.6–5.1)
Alkaline phosphatase (APISO): 78 U/L (ref 40–115)
BUN: 11 mg/dL (ref 7–25)
CALCIUM: 9.7 mg/dL (ref 8.6–10.3)
CO2: 29 mmol/L (ref 20–32)
CREATININE: 0.96 mg/dL (ref 0.60–1.35)
Chloride: 101 mmol/L (ref 98–110)
GFR, EST AFRICAN AMERICAN: 120 mL/min/{1.73_m2} (ref >=60)
GFR, EST NON AFRICAN AMERICAN: 103 mL/min/{1.73_m2} (ref >=60)
GLUCOSE: 99 mg/dL (ref 65–99)
Globulin: 2.6 g/dL (calc) (ref 1.9–3.7)
Potassium: 4.6 mmol/L (ref 3.5–5.3)
Sodium: 139 mmol/L (ref 135–146)
TOTAL PROTEIN: 7.5 g/dL (ref 6.1–8.1)
Total Bilirubin: 1.2 mg/dL (ref 0.2–1.2)

## 2016-10-12 LAB — MAGNESIUM: MAGNESIUM: 2.2 mg/dL (ref 1.5–2.5)

## 2016-10-12 LAB — TSH: TSH: 1.54 mIU/L (ref 0.40–4.50)

## 2016-10-13 ENCOUNTER — Encounter: Payer: Self-pay | Admitting: Osteopathic Medicine

## 2016-10-13 ENCOUNTER — Encounter: Payer: BLUE CROSS/BLUE SHIELD | Admitting: Osteopathic Medicine

## 2016-10-16 ENCOUNTER — Encounter: Payer: Self-pay | Admitting: Osteopathic Medicine

## 2016-10-23 ENCOUNTER — Encounter: Payer: Self-pay | Admitting: Osteopathic Medicine

## 2016-10-28 ENCOUNTER — Telehealth: Payer: Self-pay | Admitting: Osteopathic Medicine

## 2016-10-28 NOTE — Telephone Encounter (Signed)
Error

## 2016-11-03 ENCOUNTER — Ambulatory Visit: Payer: BLUE CROSS/BLUE SHIELD | Admitting: Osteopathic Medicine

## 2017-06-02 ENCOUNTER — Ambulatory Visit: Payer: BLUE CROSS/BLUE SHIELD | Admitting: Podiatry

## 2017-06-02 ENCOUNTER — Encounter: Payer: Self-pay | Admitting: Podiatry

## 2017-06-02 ENCOUNTER — Ambulatory Visit (INDEPENDENT_AMBULATORY_CARE_PROVIDER_SITE_OTHER): Payer: BLUE CROSS/BLUE SHIELD

## 2017-06-02 ENCOUNTER — Encounter

## 2017-06-02 VITALS — BP 137/97 | HR 85 | Resp 16

## 2017-06-02 DIAGNOSIS — G5761 Lesion of plantar nerve, right lower limb: Secondary | ICD-10-CM

## 2017-06-02 NOTE — Patient Instructions (Signed)
Pre-Operative Instructions  Congratulations, you have decided to take an important step towards improving your quality of life.  You can be assured that the doctors and staff at [facility] will be with you every step of the way.  Here are some important things you should know:  1. Plan to be at the surgery center/hospital at least 1 (one) hour prior to your scheduled time, unless otherwise directed by the surgical center/hospital staff.  You must have a responsible adult accompany you, remain during the surgery and drive you home.  Make sure you have directions to the surgical center/hospital to ensure you arrive on time. 2. If you are having surgery at [facility] or [facility] hospitals, you will need a copy of your medical history and physical form from your family physician within one month prior to the date of surgery. We will give you a form for your primary physician to complete.  3. We make every effort to accommodate the date you request for surgery.  However, there are times where surgery dates or times have to be moved.  We will contact you as soon as possible if a change in schedule is required.   4. No aspirin/ibuprofen for one week before surgery.  If you are on aspirin, any non-steroidal anti-inflammatory medications (Mobic, Aleve, Ibuprofen) should not be taken seven (7) days prior to your surgery.  You make take Tylenol for pain prior to surgery.  5. Medications - If you are taking daily heart and blood pressure medications, seizure, reflux, allergy, asthma, anxiety, pain or diabetes medications, make sure you notify the surgery center/hospital before the day of surgery so they can tell you which medications you should take or avoid the day of surgery. 6. No food or drink after midnight the night before surgery unless directed otherwise by surgical center/hospital staff. 7. No alcoholic beverages 24-hours prior to surgery.  No smoking 24-hours prior or 24-hours after  surgery. 8. Wear loose pants or shorts. They should be loose enough to fit over bandages, boots, and casts. 9. Don't wear slip-on shoes. Sneakers are preferred. 10. Bring your boot with you to the surgery center/hospital.  Also bring crutches or a walker if your physician has prescribed it for you.  If you do not have this equipment, it will be provided for you after surgery. 11. If you have not been contacted by the surgery center/hospital by the day before your surgery, call to confirm the date and time of your surgery. 12. Leave-time from work may vary depending on the type of surgery you have.  Appropriate arrangements should be made prior to surgery with your employer. 13. Prescriptions will be provided immediately following surgery by your doctor.  Fill these as soon as possible after surgery and take the medication as directed. Pain medications will not be refilled on weekends and must be approved by the doctor. 14. Remove nail polish on the operative foot and avoid getting pedicures prior to surgery. 15. Wash the night before surgery.  The night before surgery wash the foot and leg well with water and the antibacterial soap provided. Be sure to pay special attention to beneath the toenails and in between the toes.  Wash for at least three (3) minutes. Rinse thoroughly with water and dry well with a towel.  Perform this wash unless told not to do so by your physician.  Enclosed: 1 Ice pack (please put in freezer the night before surgery)   1 Hibiclens skin cleaner     Pre-op instructions  If you have any questions regarding the instructions, please do not hesitate to call our office.  [place]: [address], [place], [place] [address] -- [phone]  [place]: [address] [place], [place] [address] -- [phone]  [place]: [address]  [place], [place] [address] -- [phone]  [place]: [address], [address], [place], [place] [address] -- [phone]  Website:  [email or web address] 

## 2017-06-02 NOTE — Progress Notes (Signed)
Subjective:   Patient ID: Austin Powers, male   DOB: 32 y.o.   MRN: [id number]   HPI Patient is had several year history of shooting burning pain between the third and fourth toes on the right foot and states at times he feels like it pops and has significant family history with mother and aunt having had neuroma surgery in the past.  Patient states it is life altering and is not able to be active and patient does not smoke and likes to be active   Review of Systems  All other systems reviewed and are negative.       Objective:  Physical Exam  Constitutional: He appears well-developed and well-nourished.  Cardiovascular: Intact distal pulses.  Pulmonary/Chest: Effort normal.  Musculoskeletal: Normal range of motion.  Neurological: He is alert.  Skin: Skin is warm.  Nursing note and vitals reviewed.   Neurovascular status intact muscle strength is adequate range of motion within normal limits with patient noted to have exquisite discomfort third interspace right with shooting discomfort into the adjacent digits and what appears to be positive Mulder sign.  I checked the metatarsal phalangeal joints and did not note inflammation here and patient had normal lesser MPJ movement bilateral.  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  We discussed consideration for injection therapy orthotics wider shoes which she has tried or surgery and patient states he is tired of this pain has a family history of this with several history duration and wants to get it fixed.  I allow him to read consent form going over alternative treatments complications and the fact there is no long-term guarantees as far as success of procedure and patient completely understands this and is willing to accept risk and at this time signed consent form  after extensive review.  Patient understands total recovery can take 6 months and there is no guarantee this will solve all of his problems and he still may have some numbness between his toes  X-rays indicate that there is no signs of arthritis or metatarsal parabola issues

## 2017-06-06 ENCOUNTER — Encounter: Payer: Self-pay | Admitting: Podiatry

## 2017-06-06 DIAGNOSIS — G5781 Other specified mononeuropathies of right lower limb: Secondary | ICD-10-CM | POA: Diagnosis not present

## 2017-06-06 DIAGNOSIS — M79672 Pain in left foot: Secondary | ICD-10-CM | POA: Diagnosis not present

## 2017-06-06 DIAGNOSIS — G5762 Lesion of plantar nerve, left lower limb: Secondary | ICD-10-CM | POA: Diagnosis not present

## 2017-06-06 DIAGNOSIS — Z01818 Encounter for other preprocedural examination: Secondary | ICD-10-CM | POA: Diagnosis not present

## 2017-06-06 DIAGNOSIS — G5761 Lesion of plantar nerve, right lower limb: Secondary | ICD-10-CM | POA: Diagnosis not present

## 2017-06-08 ENCOUNTER — Telehealth: Payer: Self-pay | Admitting: Podiatry

## 2017-06-08 NOTE — Telephone Encounter (Signed)
I had surgery on Tuesday with Dr. Paulla Dolly. I wanted to ask a question about the wrapping on my surgery foot. My wife said after the surgery that she believed it was not to be removed, that was her understanding. We have not removed it but I wanted to double check on that and make sure I don't need to remove any part of it to move my foot around before my follow up appointment on Wednesday 05 June. My number is [phone]. Thanks so much. Bye.

## 2017-06-08 NOTE — Telephone Encounter (Signed)
I informed pt the surgical dressing should remain in place clean and dry until changed at the 1st POV. I instructed pt not to be up on his surgery foot, have below the heart for more than 15 minutes per hour, and to ice 15 minutes/hour protecting the skin from the ice pack with a cloth. I told pt he should sleep in the surgery shoe, but if was not getting good sleep, he could sleep without but must walk in the surgery shoe at all times. I told pt to call with concerns. Pt states understanding.

## 2017-06-14 ENCOUNTER — Encounter: Payer: Self-pay | Admitting: Podiatry

## 2017-06-14 ENCOUNTER — Ambulatory Visit (INDEPENDENT_AMBULATORY_CARE_PROVIDER_SITE_OTHER): Payer: BLUE CROSS/BLUE SHIELD | Admitting: Podiatry

## 2017-06-14 VITALS — BP 118/84 | HR 72 | Temp 99.3°F

## 2017-06-14 DIAGNOSIS — G5761 Lesion of plantar nerve, right lower limb: Secondary | ICD-10-CM

## 2017-06-15 NOTE — Progress Notes (Signed)
Subjective:   Patient ID: Austin Powers, male   DOB: 32 y.o.   MRN: [id number]   HPI Patient presents stating that he is having minimal discomfort but he felt like his had a few incidents of increased heart rate and has had some lightheadedness.  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  After period of time he had no issue at all he was able to be discharged and walk under his own power and we did apply sterile dressing today and gave him instructions on continued elevation compression immobilization

## 2017-06-27 ENCOUNTER — Encounter: Payer: Self-pay | Admitting: Podiatry

## 2017-08-03 ENCOUNTER — Other Ambulatory Visit: Payer: Self-pay | Admitting: Family Medicine

## 2017-08-04 MED ORDER — CLOBETASOL PROPIONATE 0.05 % EX CREA: 1.0000 "application " | TOPICAL_CREAM | Freq: Two times a day (BID) | CUTANEOUS | 0 refills | Status: DC

## 2017-12-05 ENCOUNTER — Encounter: Payer: Self-pay | Admitting: Osteopathic Medicine

## 2018-01-01 ENCOUNTER — Encounter: Payer: Self-pay | Admitting: Family Medicine

## 2018-01-01 ENCOUNTER — Ambulatory Visit: Payer: BLUE CROSS/BLUE SHIELD | Admitting: Family Medicine

## 2018-01-01 VITALS — BP 126/83 | HR 88 | Temp 98.5°F | Ht 67.0 in | Wt 175.0 lb

## 2018-01-01 DIAGNOSIS — R05 Cough: Secondary | ICD-10-CM | POA: Diagnosis not present

## 2018-01-01 DIAGNOSIS — R059 Cough, unspecified: Secondary | ICD-10-CM

## 2018-01-01 MED ORDER — ALBUTEROL SULFATE HFA 108 (90 BASE) MCG/ACT IN AERS: 2.0000 | INHALATION_SPRAY | Freq: Four times a day (QID) | RESPIRATORY_TRACT | 0 refills | Status: DC | PRN

## 2018-01-01 MED ORDER — BENZONATATE 200 MG PO CAPS: 200.0000 mg | ORAL_CAPSULE | Freq: Three times a day (TID) | ORAL | 0 refills | Status: DC | PRN

## 2018-01-01 NOTE — Progress Notes (Signed)
Acute Office Visit  Subjective:    Patient ID: Austin Powers, male    DOB: Dec 24, 1985, 32 y.o.   MRN: [id number]  Chief Complaint  Patient presents with  . Cough    x 2wks denies f/s/c taking dayquil he did report experiencing some jaw pain that subsided     HPI Patient is in today for a dry cough x2 weeks.  Denies any fever sweats or chills he is been mostly using DayQuil.  He has a little bit of jaw pain but it did seem to subside.  He says that it actually started when he took a trip to [place].  He was experiencing initially just a very dry throat.  And he was been exposed to secondhand smoke which normally is a trigger for him.  He says ever since then he is just had a dry cough.  He has a little postnasal drip but says it is no more than his usual he uses fluticasone daily.  Is not had any significant nasal congestion sore throat or ear pain.  No fever sweats or chills.  He does use ranitidine about twice a week for reflux related symptoms.  He has noticed some intermittent tightness in his sternum.  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  Past Medical History:  Diagnosis Date  . Asthma   . Seasonal allergies     No past surgical history on file.  Family History  Problem Relation Age of Onset  . Cholelithiasis Mother   . Prostate cancer Maternal Grandfather     Social History   Socioeconomic History  . Marital status: Married    Spouse name: Not on file  . Number of children: Not on file  . Years of education: Not on file  . Highest education level: Not on file  Occupational History  . Not on file  Social Needs  . Financial resource strain: Not on file  . Food insecurity:    Worry: Not on file    Inability: Not on file  . Transportation needs:    Medical: Not on file     Non-medical: Not on file  Tobacco Use  . Smoking status: Never Smoker  . Smokeless tobacco: Never Used  Substance and Sexual Activity  . Alcohol use: Yes  . Drug use: No  . Sexual activity: Yes  Lifestyle  . Physical activity:    Days per week: Not on file    Minutes per session: Not on file  . Stress: Not on file  Relationships  . Social connections:    Talks on phone: Not on file    Gets together: Not on file    Attends religious service: Not on file    Active member of club or organization: Not on file    Attends meetings of clubs or organizations: Not on file    Relationship status: Not on file  . Intimate partner violence:    Fear of current or ex partner: Not on file    Emotionally abused: Not on file    Physically abused: Not on file    Forced sexual activity: Not on file  Other Topics Concern  . Not on file  Social History Narrative  . Not on file    Outpatient Medications Prior to Visit  Medication Sig Dispense Refill  . AMBULATORY NON FORMULARY MEDICATION Take 1 tablet by mouth 2 (two) times daily. Medication Name: [facility]    . Cholecalciferol (VITAMIN D-3 PO) Take 2,000 Int'l Units by mouth.    Marland Kitchen CINNAMON PO Take 2,000 mg by mouth.    . clobetasol cream (TEMOVATE) 9.93 % Apply 1 application topically 2 (two) times daily. 30 g 0  . Fluticasone Propionate, Inhal, 100 MCG/BLIST AEPB Inhale 100 mcg into the lungs as needed. Patient takes 2 sprays as needed    . loratadine (CLARITIN) 10 MG tablet Take 10 mg by mouth daily.    . Magnesium 250 MG TABS Take 250 mg by mouth daily. Patient takes 1-2 tabs daily    . Omega 3-6-9 Fatty Acids (OMEGA 3-6-9 PO) Take 900 mg by mouth.    . ranitidine (ZANTAC) 150 MG capsule Take 150 mg by mouth 1 day or 1 dose.    . vitamin C (ASCORBIC ACID) 500 MG tablet Take 500 mg by mouth 2 (two) times daily.     No facility-administered medications prior to visit.     Allergies  Allergen Reactions  . Gluten Meal     ROS      Objective:    Physical Exam  Constitutional: He is oriented to person, place, and time. He appears well-developed and well-nourished.  HENT:  Head: Normocephalic and atraumatic.  Right Ear: External ear normal.  Left Ear: External ear normal.  Nose: Nose normal.  Mouth/Throat: Oropharynx is clear and moist.  TMs and canals are clear.   Eyes: Pupils are equal, round, and reactive to light. Conjunctivae and EOM are normal.  Neck: Neck supple. No thyromegaly present.  Cardiovascular: Normal rate and normal heart sounds.  Pulmonary/Chest: Effort normal and breath sounds normal.  Lymphadenopathy:    He has no cervical adenopathy.  Neurological: He is alert and oriented to person, place, and time.  Skin: Skin is warm and dry.  Psychiatric: He has a normal mood and affect.    BP 126/83   Pulse 88   Temp 98.5 F (36.9 C) (Oral)   Ht 5\' 7"  (1.702 m)   Wt 175 lb (79.4 kg)   SpO2 100%   BMI 27.41 kg/m  Wt Readings from Last 3 Encounters:  01/01/18 175 lb (79.4 kg)  10/11/16 164 lb (74.4 kg)  03/17/16 158 lb (71.7 kg)    Health Maintenance Due  Topic Date Due  . HIV Screening  05/20/1998    There are no preventive care reminders to display for this patient.   Lab Results  Component Value Date   TSH 1.54 10/11/2016   Lab Results  Component Value Date   WBC 5.1 10/11/2016   HGB 16.3 10/11/2016   HCT 48.0 10/11/2016   MCV 89.2 10/11/2016   PLT 284 10/11/2016   Lab Results  Component Value Date   NA 139 10/11/2016   K 4.6 10/11/2016   CO2 29 10/11/2016   GLUCOSE 99 10/11/2016   BUN 11 10/11/2016   CREATININE 0.96 10/11/2016   BILITOT 1.2 10/11/2016   ALKPHOS 80 03/17/2016   AST 20 10/11/2016   ALT 24 10/11/2016   PROT 7.5 10/11/2016   ALBUMIN 4.7 03/17/2016   CALCIUM 9.7 10/11/2016   Lab Results  Component Value Date   CHOL 157 03/17/2016   Lab Results  Component Value Date   HDL 45 03/17/2016   Lab Results  Component Value Date   LDLCALC 98  03/17/2016  Lab Results  Component Value Date   TRIG 69 03/17/2016   Lab Results  Component Value Date   CHOLHDL 3.5 03/17/2016   Lab Results  Component Value Date   HGBA1C 5.4 04/30/2010       Assessment & Plan:   Problem List Items Addressed This Visit    None    Visit Diagnoses    Cough    -  Primary     Based on his description and normal exam less likely to be viral.  Suspect secondary causes.  He already uses fluticasone regularly.  Likely from reflux. Will have him use his H2 blocker or PPI BID for 1-2 weeks and send in a rx for albuterol. Will get CXR at end of the week if not better.    Meds ordered this encounter  Medications  . albuterol (PROVENTIL HFA;VENTOLIN HFA) 108 (90 Base) MCG/ACT inhaler    Sig: Inhale 2 puffs into the lungs every 6 (six) hours as needed for wheezing or shortness of breath.    Dispense:  1 Inhaler    Refill:  0  . benzonatate (TESSALON) 200 MG capsule    Sig: Take 1 capsule (200 mg total) by mouth 3 (three) times daily as needed for cough.    Dispense:  30 capsule    Refill:  0     Beatrice Lecher, MD

## 2018-01-01 NOTE — Patient Instructions (Signed)
Start your ranitidine twice a day or can use prilosec or prevacid  If would like Call back if not better on Friday and we can get a chest xray if needed.

## 2018-01-05 ENCOUNTER — Encounter: Payer: Self-pay | Admitting: Family Medicine

## 2018-02-08 ENCOUNTER — Encounter: Payer: Self-pay | Admitting: Osteopathic Medicine

## 2018-02-08 ENCOUNTER — Ambulatory Visit: Payer: BLUE CROSS/BLUE SHIELD | Admitting: Osteopathic Medicine

## 2018-02-08 VITALS — BP 127/86 | HR 87 | Temp 98.2°F | Wt 174.2 lb

## 2018-02-08 DIAGNOSIS — J069 Acute upper respiratory infection, unspecified: Secondary | ICD-10-CM | POA: Diagnosis not present

## 2018-02-08 DIAGNOSIS — R070 Pain in throat: Secondary | ICD-10-CM | POA: Diagnosis not present

## 2018-02-08 LAB — POCT RAPID STREP A (OFFICE): RAPID STREP A SCREEN: NEGATIVE

## 2018-02-08 MED ORDER — PREDNISONE 20 MG PO TABS: 20.0000 mg | ORAL_TABLET | Freq: Two times a day (BID) | ORAL | 0 refills | Status: DC

## 2018-02-08 MED ORDER — LIDOCAINE VISCOUS HCL 2 % MT SOLN: 5.0000 mL | OROMUCOSAL | 1 refills | Status: DC | PRN

## 2018-02-08 NOTE — Progress Notes (Signed)
HPI: Austin Powers is a 33 y.o. male who  has a past medical history of Asthma and Seasonal allergies.  he presents to [facility] today, 02/08/18,  for chief complaint of: Sick - throat    . Location/Quality: lump in throat when swallowing, feels like uvula swollen.  . Duration: <1 day, started at 03:00 today  . Modifying factors: no otc meds . Assoc signs/symptoms: few days ago had facial/dental pain w/ pressure but this has resolved.       Past medical, surgical, social and family history reviewed and updated as necessary.   Current medication list and allergy/intolerance information reviewed:    Current Outpatient Medications  Medication Sig Dispense Refill  . albuterol (PROVENTIL HFA;VENTOLIN HFA) 108 (90 Base) MCG/ACT inhaler Inhale 2 puffs into the lungs every 6 (six) hours as needed for wheezing or shortness of breath. 1 Inhaler 0  . AMBULATORY NON FORMULARY MEDICATION Take 1 tablet by mouth 2 (two) times daily. Medication Name: [facility]    . Cholecalciferol (VITAMIN D-3 PO) Take 2,000 Int'l Units by mouth.    Marland Kitchen CINNAMON PO Take 2,000 mg by mouth.    . clobetasol cream (TEMOVATE) 5.40 % Apply 1 application topically 2 (two) times daily. 30 g 0  . Fluticasone Propionate, Inhal, 100 MCG/BLIST AEPB Inhale 100 mcg into the lungs as needed. Patient takes 2 sprays as needed    . loratadine (CLARITIN) 10 MG tablet Take 10 mg by mouth daily.    . Magnesium 250 MG TABS Take 250 mg by mouth daily. Patient takes 1-2 tabs daily    . Omega 3-6-9 Fatty Acids (OMEGA 3-6-9 PO) Take 900 mg by mouth.    . ranitidine (ZANTAC) 150 MG capsule Take 150 mg by mouth 1 day or 1 dose.    . vitamin C (ASCORBIC ACID) 500 MG tablet Take 500 mg by mouth 2 (two) times daily.    . benzonatate (TESSALON) 200 MG capsule Take 1 capsule (200 mg total) by mouth 3 (three) times daily as needed for cough. (Patient not taking: Reported on 02/08/2018) 30 capsule 0  . lidocaine  (XYLOCAINE) 2 % solution Use as directed 5-10 mLs in the mouth or throat every 3 (three) hours as needed (mouth/throat pain). 100 mL 1  . predniSONE (DELTASONE) 20 MG tablet Take 1 tablet (20 mg total) by mouth 2 (two) times daily with a meal. 10 tablet 0   No current facility-administered medications for this visit.     Allergies  Allergen Reactions  . Gluten Meal       Review of Systems:  Constitutional:  No  fever, no chills, No recent illness, No unintentional weight changes. No significant fatigue.   HEENT: No  headache, no vision change, no hearing change, +sore throat, +sinus pressure  Cardiac: No  chest pain, No  pressure, No palpitations  Respiratory:  No  shortness of breath. +mild chronic Cough  Gastrointestinal: No  abdominal pain, No  nausea, No  vomiting,  No  blood in stool, No  diarrhea   Musculoskeletal: No new myalgia/arthralgia  Skin: No  Rash  Neurologic: No  weakness, No  dizziness  Exam:  BP 127/86 (BP Location: Left Arm, Patient Position: Sitting, Cuff Size: Normal)   Pulse 87   Temp 98.2 F (36.8 C) (Oral)   Wt 174 lb 3.2 oz (79 kg)   BMI 27.28 kg/m   Constitutional: VS see above. General Appearance: alert, well-developed, well-nourished, NAD  Eyes: Normal lids  and conjunctive, non-icteric sclera  Ears, Nose, Mouth, Throat: MMM, Normal external inspection ears/nares/mouth/lips/gums. TM normal bilaterally. Pharynx/tonsils +erythema, no exudate. Nasal mucosa normal.   Neck: No masses, trachea midline. No tenderness/mass appreciated. No lymphadenopathy  Respiratory: Normal respiratory effort. no wheeze, no rhonchi, no rales  Cardiovascular: S1/S2 normal, no murmur, no rub/gallop auscultated. RRR. No lower extremity edema.   Gastrointestinal: Nontender, no masses. Bowel sounds normal.  Musculoskeletal: Gait normal.   Neurological: Normal balance/coordination. No tremor.   Skin: warm, dry, intact.   Psychiatric: Normal judgment/insight.  Normal mood and affect.  MODIFIED CENTOR CRITERIA (ponts if "yes"): Tonsillar exudate (1): no Tender Ant Cervical LN (1): no Absence of cough (1): no Fever (1): no Age  51-45 (0): no SCORE: 0      Results for orders placed or performed in visit on 02/08/18 (from the past 24 hour(s))  POCT rapid strep A     Status: None   Collection Time: 02/08/18 10:46 AM  Result Value Ref Range   Rapid Strep A Screen Negative Negative        ASSESSMENT/PLAN: The primary encounter diagnosis was Viral URI. A diagnosis of Throat pain was also pertinent to this visit.   Meds ordered this encounter  Medications  . lidocaine (XYLOCAINE) 2 % solution    Sig: Use as directed 5-10 mLs in the mouth or throat every 3 (three) hours as needed (mouth/throat pain).    Dispense:  100 mL    Refill:  1  . predniSONE (DELTASONE) 20 MG tablet    Sig: Take 1 tablet (20 mg total) by mouth 2 (two) times daily with a meal.    Dispense:  10 tablet    Refill:  0    Orders Placed This Encounter  Procedures  . Culture, Group A Strep  . POCT rapid strep A    Patient Instructions  Medications & Home Remedies for Upper Respiratory Illness   Note: the following list assumes no pregnancy, normal liver & kidney function and no other drug interactions. Dr. Sheppard Coil has highlighted medications which are safe for you to use, but these may not be appropriate for everyone. Always ask a pharmacist or qualified medical provider if you have any questions!    Aches/Pains, Fever, Headache OTC Acetaminophen (Tylenol) 500 mg tablets - take max 2 tablets (1000 mg) every 6 hours (4 times per day)  OTC Ibuprofen (Motrin) 200 mg tablets - take max 4 tablets (800 mg) every 6 hours   Sinus Congestion OTC Nasal Saline if desired to rinse OTC Oxymetolazone (Afrin, others) sparing use due to rebound congestion, NEVER use in kids OTC Phenylephrine (Sudafed) 10 mg tablets every 4 hours (or the 12-hour formulation) OTC  Diphenhydramine (Benadryl) 25 mg tablets - take max 2 tablets every 4 hours   Cough & Sore Throat Rx Lidocaine Syrup as directed to help numb sore throat OTC Dextromethorphan (Robitussin, others) - cough suppressant OTC Guaifenesin (Robitussin, Mucinex, others) - expectorant (helps cough up mucus) (Dextromethorphan and Guaifenesin also come in a combination tablet/syrup) OTC Lozenges w/ Benzocaine + Menthol (Cepacol) Honey - as much as you want! Teas which "coat the throat" - look for ingredients Elm Bark, Licorice Root, Marshmallow Root   Other Prescription Oral Steroids to decrease inflammation/swelling and improve energy OTC Zinc Lozenges within 24 hours of symptoms onset - mixed evidence this shortens the duration of the common cold Don't waste your money on Vitamin C or Echinacea in acute illness - it's already too late!  Visit summary with medication list and pertinent instructions was printed for patient to review. All questions at time of visit were answered - patient instructed to contact office with any additional concerns or updates. ER/RTC precautions were reviewed with the patient.   Follow-up plan: Return if symptoms worsen or fail to improve.    Please note: voice recognition software was used to produce this document, and typos may escape review. Please contact Dr. Sheppard Coil for any needed clarifications.

## 2018-02-08 NOTE — Patient Instructions (Signed)
Medications & Home Remedies for Upper Respiratory Illness   Note: the following list assumes no pregnancy, normal liver & kidney function and no other drug interactions. Dr. Sheppard Coil has highlighted medications which are safe for you to use, but these may not be appropriate for everyone. Always ask a pharmacist or qualified medical provider if you have any questions!    Aches/Pains, Fever, Headache OTC Acetaminophen (Tylenol) 500 mg tablets - take max 2 tablets (1000 mg) every 6 hours (4 times per day)  OTC Ibuprofen (Motrin) 200 mg tablets - take max 4 tablets (800 mg) every 6 hours   Sinus Congestion OTC Nasal Saline if desired to rinse OTC Oxymetolazone (Afrin, others) sparing use due to rebound congestion, NEVER use in kids OTC Phenylephrine (Sudafed) 10 mg tablets every 4 hours (or the 12-hour formulation) OTC Diphenhydramine (Benadryl) 25 mg tablets - take max 2 tablets every 4 hours   Cough & Sore Throat Rx Lidocaine Syrup as directed to help numb sore throat OTC Dextromethorphan (Robitussin, others) - cough suppressant OTC Guaifenesin (Robitussin, Mucinex, others) - expectorant (helps cough up mucus) (Dextromethorphan and Guaifenesin also come in a combination tablet/syrup) OTC Lozenges w/ Benzocaine + Menthol (Cepacol) Honey - as much as you want! Teas which "coat the throat" - look for ingredients Elm Bark, Licorice Root, Marshmallow Root   Other Prescription Oral Steroids to decrease inflammation/swelling and improve energy OTC Zinc Lozenges within 24 hours of symptoms onset - mixed evidence this shortens the duration of the common cold Don't waste your money on Vitamin C or Echinacea in acute illness - it's already too late!

## 2018-02-13 ENCOUNTER — Encounter: Payer: Self-pay | Admitting: Osteopathic Medicine

## 2018-02-15 MED ORDER — OMEPRAZOLE 40 MG PO CPDR: 40.0000 mg | DELAYED_RELEASE_CAPSULE | Freq: Every day | ORAL | 0 refills | Status: DC

## 2018-03-10 ENCOUNTER — Other Ambulatory Visit: Payer: Self-pay | Admitting: Osteopathic Medicine

## 2018-04-02 ENCOUNTER — Encounter: Payer: Self-pay | Admitting: Osteopathic Medicine

## 2018-04-02 NOTE — Telephone Encounter (Signed)
Since he has 2 weeks left I thought you would be better to answer this.

## 2018-04-04 ENCOUNTER — Other Ambulatory Visit: Payer: Self-pay | Admitting: Osteopathic Medicine

## 2018-04-05 MED ORDER — OMEPRAZOLE 40 MG PO CPDR: 40.0000 mg | DELAYED_RELEASE_CAPSULE | Freq: Every day | ORAL | 0 refills | Status: DC

## 2018-09-24 ENCOUNTER — Encounter: Payer: Self-pay | Admitting: Osteopathic Medicine

## 2018-09-26 MED ORDER — OMEPRAZOLE 40 MG PO CPDR: 40.0000 mg | DELAYED_RELEASE_CAPSULE | Freq: Every day | ORAL | 0 refills | Status: DC

## 2018-10-10 DIAGNOSIS — L501 Idiopathic urticaria: Secondary | ICD-10-CM | POA: Diagnosis not present

## 2018-10-10 DIAGNOSIS — J301 Allergic rhinitis due to pollen: Secondary | ICD-10-CM | POA: Diagnosis not present

## 2018-10-10 DIAGNOSIS — R1310 Dysphagia, unspecified: Secondary | ICD-10-CM | POA: Diagnosis not present

## 2018-10-10 DIAGNOSIS — L209 Atopic dermatitis, unspecified: Secondary | ICD-10-CM | POA: Diagnosis not present

## 2018-10-18 DIAGNOSIS — L501 Idiopathic urticaria: Secondary | ICD-10-CM | POA: Diagnosis not present

## 2018-10-18 DIAGNOSIS — F605 Obsessive-compulsive personality disorder: Secondary | ICD-10-CM | POA: Diagnosis not present

## 2018-10-18 DIAGNOSIS — L209 Atopic dermatitis, unspecified: Secondary | ICD-10-CM | POA: Diagnosis not present

## 2018-10-18 DIAGNOSIS — R1310 Dysphagia, unspecified: Secondary | ICD-10-CM | POA: Diagnosis not present

## 2018-10-18 DIAGNOSIS — F410 Panic disorder [episodic paroxysmal anxiety] without agoraphobia: Secondary | ICD-10-CM | POA: Diagnosis not present

## 2018-10-18 DIAGNOSIS — J301 Allergic rhinitis due to pollen: Secondary | ICD-10-CM | POA: Diagnosis not present

## 2018-10-18 DIAGNOSIS — Z23 Encounter for immunization: Secondary | ICD-10-CM | POA: Diagnosis not present

## 2018-10-23 DIAGNOSIS — F411 Generalized anxiety disorder: Secondary | ICD-10-CM | POA: Diagnosis not present

## 2018-10-23 DIAGNOSIS — F410 Panic disorder [episodic paroxysmal anxiety] without agoraphobia: Secondary | ICD-10-CM | POA: Diagnosis not present

## 2018-10-30 DIAGNOSIS — F410 Panic disorder [episodic paroxysmal anxiety] without agoraphobia: Secondary | ICD-10-CM | POA: Diagnosis not present

## 2018-10-30 DIAGNOSIS — F605 Obsessive-compulsive personality disorder: Secondary | ICD-10-CM | POA: Diagnosis not present

## 2018-10-30 DIAGNOSIS — F411 Generalized anxiety disorder: Secondary | ICD-10-CM | POA: Diagnosis not present

## 2018-11-01 DIAGNOSIS — F410 Panic disorder [episodic paroxysmal anxiety] without agoraphobia: Secondary | ICD-10-CM | POA: Diagnosis not present

## 2018-11-01 DIAGNOSIS — F411 Generalized anxiety disorder: Secondary | ICD-10-CM | POA: Diagnosis not present

## 2018-11-12 DIAGNOSIS — F410 Panic disorder [episodic paroxysmal anxiety] without agoraphobia: Secondary | ICD-10-CM | POA: Diagnosis not present

## 2018-11-12 DIAGNOSIS — F411 Generalized anxiety disorder: Secondary | ICD-10-CM | POA: Diagnosis not present

## 2018-11-12 DIAGNOSIS — F605 Obsessive-compulsive personality disorder: Secondary | ICD-10-CM | POA: Diagnosis not present

## 2018-11-12 DIAGNOSIS — F331 Major depressive disorder, recurrent, moderate: Secondary | ICD-10-CM | POA: Diagnosis not present

## 2018-11-19 DIAGNOSIS — F605 Obsessive-compulsive personality disorder: Secondary | ICD-10-CM | POA: Diagnosis not present

## 2018-11-19 DIAGNOSIS — F410 Panic disorder [episodic paroxysmal anxiety] without agoraphobia: Secondary | ICD-10-CM | POA: Diagnosis not present

## 2018-11-19 DIAGNOSIS — F411 Generalized anxiety disorder: Secondary | ICD-10-CM | POA: Diagnosis not present

## 2018-11-28 DIAGNOSIS — F410 Panic disorder [episodic paroxysmal anxiety] without agoraphobia: Secondary | ICD-10-CM | POA: Diagnosis not present

## 2018-11-28 DIAGNOSIS — F411 Generalized anxiety disorder: Secondary | ICD-10-CM | POA: Diagnosis not present

## 2018-12-03 DIAGNOSIS — F411 Generalized anxiety disorder: Secondary | ICD-10-CM | POA: Diagnosis not present

## 2018-12-03 DIAGNOSIS — F410 Panic disorder [episodic paroxysmal anxiety] without agoraphobia: Secondary | ICD-10-CM | POA: Diagnosis not present

## 2018-12-03 DIAGNOSIS — F605 Obsessive-compulsive personality disorder: Secondary | ICD-10-CM | POA: Diagnosis not present

## 2018-12-10 DIAGNOSIS — F429 Obsessive-compulsive disorder, unspecified: Secondary | ICD-10-CM | POA: Diagnosis not present

## 2018-12-10 DIAGNOSIS — F410 Panic disorder [episodic paroxysmal anxiety] without agoraphobia: Secondary | ICD-10-CM | POA: Diagnosis not present

## 2018-12-10 DIAGNOSIS — F411 Generalized anxiety disorder: Secondary | ICD-10-CM | POA: Diagnosis not present

## 2018-12-11 DIAGNOSIS — F429 Obsessive-compulsive disorder, unspecified: Secondary | ICD-10-CM | POA: Diagnosis not present

## 2018-12-11 DIAGNOSIS — F411 Generalized anxiety disorder: Secondary | ICD-10-CM | POA: Diagnosis not present

## 2018-12-18 DIAGNOSIS — F411 Generalized anxiety disorder: Secondary | ICD-10-CM | POA: Diagnosis not present

## 2018-12-18 DIAGNOSIS — F429 Obsessive-compulsive disorder, unspecified: Secondary | ICD-10-CM | POA: Diagnosis not present

## 2018-12-18 DIAGNOSIS — F410 Panic disorder [episodic paroxysmal anxiety] without agoraphobia: Secondary | ICD-10-CM | POA: Diagnosis not present

## 2018-12-26 DIAGNOSIS — F429 Obsessive-compulsive disorder, unspecified: Secondary | ICD-10-CM | POA: Diagnosis not present

## 2018-12-26 DIAGNOSIS — F411 Generalized anxiety disorder: Secondary | ICD-10-CM | POA: Diagnosis not present

## 2018-12-26 DIAGNOSIS — F410 Panic disorder [episodic paroxysmal anxiety] without agoraphobia: Secondary | ICD-10-CM | POA: Diagnosis not present

## 2019-01-07 DIAGNOSIS — F411 Generalized anxiety disorder: Secondary | ICD-10-CM | POA: Diagnosis not present

## 2019-01-07 DIAGNOSIS — F429 Obsessive-compulsive disorder, unspecified: Secondary | ICD-10-CM | POA: Diagnosis not present

## 2019-01-07 DIAGNOSIS — F410 Panic disorder [episodic paroxysmal anxiety] without agoraphobia: Secondary | ICD-10-CM | POA: Diagnosis not present

## 2019-11-04 ENCOUNTER — Encounter: Payer: Self-pay | Admitting: Nurse Practitioner

## 2019-11-04 ENCOUNTER — Ambulatory Visit (INDEPENDENT_AMBULATORY_CARE_PROVIDER_SITE_OTHER): Payer: No Typology Code available for payment source | Admitting: Nurse Practitioner

## 2019-11-04 VITALS — BP 134/89 | HR 65 | Temp 98.2°F | Ht 68.25 in | Wt 177.6 lb

## 2019-11-04 DIAGNOSIS — Z23 Encounter for immunization: Secondary | ICD-10-CM | POA: Diagnosis not present

## 2019-11-04 DIAGNOSIS — D179 Benign lipomatous neoplasm, unspecified: Secondary | ICD-10-CM

## 2019-11-04 DIAGNOSIS — F411 Generalized anxiety disorder: Secondary | ICD-10-CM

## 2019-11-04 DIAGNOSIS — H9193 Unspecified hearing loss, bilateral: Secondary | ICD-10-CM

## 2019-11-04 DIAGNOSIS — Z0000 Encounter for general adult medical examination without abnormal findings: Secondary | ICD-10-CM | POA: Diagnosis not present

## 2019-11-04 DIAGNOSIS — Z8639 Personal history of other endocrine, nutritional and metabolic disease: Secondary | ICD-10-CM | POA: Diagnosis not present

## 2019-11-04 DIAGNOSIS — R6882 Decreased libido: Secondary | ICD-10-CM

## 2019-11-04 DIAGNOSIS — Z8042 Family history of malignant neoplasm of prostate: Secondary | ICD-10-CM

## 2019-11-04 DIAGNOSIS — E785 Hyperlipidemia, unspecified: Secondary | ICD-10-CM

## 2019-11-04 DIAGNOSIS — R030 Elevated blood-pressure reading, without diagnosis of hypertension: Secondary | ICD-10-CM

## 2019-11-04 DIAGNOSIS — Z0189 Encounter for other specified special examinations: Secondary | ICD-10-CM

## 2019-11-04 LAB — CBC WITH DIFFERENTIAL/PLATELET: MPV: 10.3 fL (ref 7.5–12.5)

## 2019-11-04 LAB — COMPLETE METABOLIC PANEL WITH GFR
Albumin: 4.7 g/dL (ref 3.6–5.1)
GFR, Est Non African American: 114 mL/min/{1.73_m2} (ref >=60)

## 2019-11-04 NOTE — Assessment & Plan Note (Signed)
History of dyslipidemia on previous laboratory results. Patient's diet has significantly improved since this reading was taken however we will obtain labs today to evaluate for lipid abnormalities. We will up date patient on changes to the plan of care as necessary based on laboratory results.

## 2019-11-04 NOTE — Assessment & Plan Note (Signed)
Family history of prostate cancer in his father requiring surgery and treatment. We will obtain a baseline PSA today. Recommend follow-up PSA monitoring as he ages and if symptoms begin to present.

## 2019-11-04 NOTE — Progress Notes (Signed)
BP 134/89   Pulse 65   Temp 98.2 F (36.8 C) (Oral)   Ht 5' 8.25" (1.734 m)   Wt 177 lb 9.6 oz (80.6 kg)   SpO2 98%   BMI 26.81 kg/m    Subjective:    Patient ID: Austin Powers, male    DOB: 28-Mar-1985, 34 y.o.   MRN: [id number]  HPI: Austin Powers is a 34 y.o. male presenting on 11/04/2019 for comprehensive medical examination.   Interim Problems from his last visit: Tyrez tells me he has been engaged in therapy for the past year and has been taking Pristiq and propranolol for anxiety and stress. He tells me the combination of medication and therapy have been life changing for him. He does report that he has had decreased Libido since starting the medication. He has tried bupropion, however, this exacerbated his anxiety symptoms and he stopped this. He tells me that his mental health providers requested a testosterone check before proceeding with further medication options.   He reports that he has noticed some hearing changes recently with the need to ask his wife and children to repeat themselves and turning the television volume louder. He tells me he has a family history of hearing loss and he would like to be evaluated for this.   Current medical history includes:  Patient Active Problem List   Diagnosis Date Noted  . Decreased libido 11/04/2019  . Family history of prostate cancer 11/04/2019  . Bilateral hearing loss 11/04/2019  . Lipoma 11/04/2019  . Encounter for annual physical exam 11/04/2019  . Generalized anxiety disorder 10/11/2016  . History of iron deficiency 03/17/2016  . Iliotibial band syndrome of left side 06/17/2014  . Left extensor digitorum longus tendinitis of the foot 04/16/2014  . Elevated anti-tissue transglutaminase (tTG) IgA level 06/19/2013  . Dyslipidemia 06/14/2013  . Eczema 06/14/2013  . Testicle pain 01/09/2013  . History of urinary incontinence (PVR Normal 5/12) 11/16/2011  . History of eczema 11/16/2011  . Right Epididymal cyst 11/16/2011  .  Seasonal allergies 11/09/2011  . Elevated blood-pressure reading without diagnosis of hypertension 11/09/2011  . Dysphagia 11/09/2011    He currently lives with: Wife and 3 kids (ages 77, 22, 59) He reports regular vision exams q1-5y: yes He reports regular dental exams q 70m: yes His diet consists of: All meals prepared at home (due to Celiac with self and daughter)- fully gluten free, may be higher in carbohydrates and lower in fiber.  He endorses exercise and/or activity of: every weekend hiking and walking outside. He works at: [employer]  He denies ETOH use  He denies nictoine use  He denies illegal substance use   He is currently sexually active with 1 partners. He denies concerns today about STI  He endorses concerns about skin changes today: spot on the back of the left shoulder. skin looks different. He also reports a small movable cyst on his lower back that has been present for many years and is not growing. He does reports some pain with direct pressure that feels like nerve pain. He tells me his wife is established with a dermatologist and he will try to be seen by them.   He denies concerns about bowel changes today He denies concerns about bladder changes today  Depression Screen done today and results listed below:  Depression screen [facility] 2/9 11/04/2019 10/11/2016 03/17/2016  Decreased Interest 1 1 1   Down, Depressed, Hopeless 0 1 1  PHQ - 2 Score 1 2  2  Altered sleeping 0 1 0  Tired, decreased energy 0 2 0  Change in appetite 0 1 0  Feeling bad or failure about yourself  0 1 0  Trouble concentrating 0 0 0  Moving slowly or fidgety/restless 0 0 0  Suicidal thoughts 0 - 0  PHQ-9 Score 1 7 2   Difficult doing work/chores Not difficult at all - Not difficult at all    The patient does not have a history of falls. I did complete a risk assessment for falls. A plan of care for falls was not documented.   Past Medical History:  Past Medical History:  Diagnosis  Date  . Asthma   . Seasonal allergies     Surgical History:  History reviewed. No pertinent surgical history.  Medications:  Current Outpatient Medications on File Prior to Visit  Medication Sig  . Cholecalciferol 25 MCG (1000 UT) tablet Take 1 tablet by mouth daily.  Marland Kitchen Desvenlafaxine ER (PRISTIQ) 50 MG TB24 Take 1 tablet by mouth daily.  . famotidine (PEPCID) 20 MG tablet Take 20 mg by mouth 2 (two) times daily as needed for heartburn or indigestion.  . Fluticasone Propionate, Inhal, 100 MCG/BLIST AEPB Inhale 100 mcg into the lungs as needed. Patient takes 2 sprays as needed  . Magnesium 400 MG TABS Take 1 tablet by mouth daily.  Ernestine Conrad 3-6-9 Fatty Acids (OMEGA 3-6-9 PO) Take 900 mg by mouth.  . propranolol (INDERAL) 10 MG tablet Take 10 mg by mouth 2 (two) times daily.  . Saw Palmetto 450 MG CAPS Take 1 capsule by mouth 2 (two) times daily.  . vitamin C (ASCORBIC ACID) 500 MG tablet Take 500 mg by mouth 2 (two) times daily.   No current facility-administered medications on file prior to visit.    Allergies:  Allergies  Allergen Reactions  . Cat Hair Extract Dermatitis, Itching and Other (See Comments)  . Dog Epithelium Dermatitis, Itching and Other (See Comments)  . Gluten Meal     Social History:  Social History   Socioeconomic History  . Marital status: Married    Spouse name: Not on file  . Number of children: Not on file  . Years of education: Not on file  . Highest education level: Not on file  Occupational History  . Not on file  Tobacco Use  . Smoking status: Never Smoker  . Smokeless tobacco: Never Used  Substance and Sexual Activity  . Alcohol use: Not Currently  . Drug use: No  . Sexual activity: Yes    Partners: Female    Birth control/protection: Surgical    Comment: vasectomy  Other Topics Concern  . Not on file  Social History Narrative  . Not on file   Social Determinants of Health   Financial Resource Strain:   . Difficulty of Paying  Living Expenses: Not on file  Food Insecurity:   . Worried About Charity fundraiser in the Last Year: Not on file  . Ran Out of Food in the Last Year: Not on file  Transportation Needs:   . Lack of Transportation (Medical): Not on file  . Lack of Transportation (Non-Medical): Not on file  Physical Activity:   . Days of Exercise per Week: Not on file  . Minutes of Exercise per Session: Not on file  Stress:   . Feeling of Stress : Not on file  Social Connections:   . Frequency of Communication with Friends and Family: Not on file  .  Frequency of Social Gatherings with Friends and Family: Not on file  . Attends Religious Services: Not on file  . Active Member of Clubs or Organizations: Not on file  . Attends Archivist Meetings: Not on file  . Marital Status: Not on file  Intimate Partner Violence:   . Fear of Current or Ex-Partner: Not on file  . Emotionally Abused: Not on file  . Physically Abused: Not on file  . Sexually Abused: Not on file   Social History   Tobacco Use  Smoking Status Never Smoker  Smokeless Tobacco Never Used   Social History   Substance and Sexual Activity  Alcohol Use Not Currently    Family History:  Family History  Problem Relation Age of Onset  . Cholelithiasis Mother   . Prostate cancer Maternal Grandfather     Past medical history, surgical history, medications, allergies, family history and social history reviewed with patient today and changes made to appropriate areas of the chart.   All ROS negative except what is listed above and in the HPI.      Objective:    BP 134/89   Pulse 65   Temp 98.2 F (36.8 C) (Oral)   Ht 5' 8.25" (1.734 m)   Wt 177 lb 9.6 oz (80.6 kg)   SpO2 98%   BMI 26.81 kg/m   Wt Readings from Last 3 Encounters:  11/04/19 177 lb 9.6 oz (80.6 kg)  02/08/18 174 lb 3.2 oz (79 kg)  01/01/18 175 lb (79.4 kg)    Physical Exam Vitals and nursing note reviewed.  Constitutional:      Appearance:  Normal appearance. He is normal weight.  HENT:     Head: Normocephalic.     Right Ear: Tympanic membrane, ear canal and external ear normal.     Left Ear: Tympanic membrane, ear canal and external ear normal.  Eyes:     Extraocular Movements: Extraocular movements intact.     Conjunctiva/sclera: Conjunctivae normal.     Pupils: Pupils are equal, round, and reactive to light.  Neck:     Vascular: No carotid bruit.  Cardiovascular:     Rate and Rhythm: Normal rate and regular rhythm.     Pulses: Normal pulses.     Heart sounds: Normal heart sounds. No murmur heard.   Pulmonary:     Effort: Pulmonary effort is normal.     Breath sounds: Normal breath sounds. No rhonchi.  Abdominal:     General: Abdomen is flat. Bowel sounds are normal. There is no distension.     Palpations: Abdomen is soft.     Tenderness: There is no abdominal tenderness. There is no right CVA tenderness, left CVA tenderness, guarding or rebound.  Musculoskeletal:        General: Normal range of motion.     Cervical back: Normal range of motion and neck supple. No tenderness.     Right lower leg: No edema.     Left lower leg: No edema.  Lymphadenopathy:     Cervical: No cervical adenopathy.  Skin:    General: Skin is warm and dry.     Capillary Refill: Capillary refill takes less than 2 seconds.       Neurological:     General: No focal deficit present.     Mental Status: He is alert and oriented to person, place, and time.  Psychiatric:        Mood and Affect: Mood normal.  Behavior: Behavior normal.        Thought Content: Thought content normal.        Judgment: Judgment normal.     Results for orders placed or performed in visit on 02/08/18  POCT rapid strep A  Result Value Ref Range   Rapid Strep A Screen Negative Negative      Assessment & Plan:   Problem List Items Addressed This Visit      Nervous and Auditory   Bilateral hearing loss    Reported bilateral hearing loss with  difficulty hearing high-pitched sounds and noticed will need to increase television volume.  No cerumen impaction or visible cause for hearing loss noted today. Referral to audiology placed for complete evaluation.      Relevant Orders   Ambulatory referral to Audiology     Other   Dyslipidemia (Chronic)    History of dyslipidemia on previous laboratory results. Patient's diet has significantly improved since this reading was taken however we will obtain labs today to evaluate for lipid abnormalities. We will up date patient on changes to the plan of care as necessary based on laboratory results.      Relevant Orders   COMPLETE METABOLIC PANEL WITH GFR   Lipid panel   Elevated blood-pressure reading without diagnosis of hypertension    Blood pressure 134/89 in the office today.  He does have a history of having some elevated blood pressure readings. We will obtain labs today. Recommend close monitoring of blood pressure to evaluate for the need for additional blood pressure management.      Relevant Orders   CBC with Differential/Platelet   COMPLETE METABOLIC PANEL WITH GFR   Lipid panel   History of iron deficiency    History of iron deficiency anemia. We will obtain labs today to evaluate hemoglobin levels. If hemoglobin is found to be low we will plan to order iron studies for further evaluation.       Relevant Orders   CBC with Differential/Platelet   Generalized anxiety disorder    Generalized anxiety disorder well-controlled with current use of Pristiq and propranolol in conjunction with weekly counseling sessions. The patient appears to be doing quite well on the current regimen.  Recommend continuing this. 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 We will update patient on results and make changes to plan of care as appropriate.      Relevant Medications   Desvenlafaxine ER (PRISTIQ)  50 MG TB24   Decreased libido    Decreased libido in the setting of well-controlled anxiety with Pristiq and propranolol. Discussed with the patient that this is a well-known side effect of these medications.  Unfortunately he was unable to take Wellbutrin which is typically used to help counteract the side effects. We will monitor his testosterone levels as recommended by psychiatry/psychology. If testosterone levels are found to be within the normal limits discussed with the patient the option that he may consider trying Wellbutrin once again and taking for a minimum of 4 weeks has some of the side effects that he noted may go away with continued use.  Will allow psychiatry/psychology to make further recommendations for medication management and will be happy to service support with this.      Relevant Orders   Testosterone , Free and Total   Family history of prostate cancer    Family history of prostate cancer in his father requiring surgery and treatment. We will obtain  a baseline PSA today. Recommend follow-up PSA monitoring as he ages and if symptoms begin to present.      Relevant Orders   PSA   Lipoma    Suspected lipoma to the left of center at the lumbar spine.  No warning signs noted today.  Cyst freely movable, small, painless, with no associated external symptoms on the skin. Discussed with the patient close monitoring of the area and if he begins to notice an increase in size or if the pain he has noticed when laying on hard surface becomes bothersome we can refer for removal. Due to the location and how deep the cystic lesion is I feel that general surgery would be most appropriate for this.      Encounter for annual physical exam - Primary    Annual physical exam on overall healthy 34 year old male. We will obtain labs today.  Discussed the option of HIV and hepatitis C one-time testing with the patient today.  At this time he does not feel that he has risk factors  present for either condition and therefore defers testing at this time. Recommend follow-up in approximately 1 year or sooner if needed. We will notify patient of lab results and discuss changes to the plan of care as needed.      Relevant Orders   CBC with Differential/Platelet   COMPLETE METABOLIC PANEL WITH GFR   Lipid panel    Other Visit Diagnoses    Encounter for laboratory examination       Relevant Orders   CBC with Differential/Platelet   COMPLETE METABOLIC PANEL WITH GFR   Lipid panel   Need for influenza vaccination       Relevant Orders   Flu Vaccine QUAD 36+ mos IM (Completed)       Discussed aspirin prophylaxis for myocardial infarction prevention and decision was it was not indicated  LABORATORY TESTING:  Health maintenance labs ordered today as discussed above.  - STI testing: deferred  The natural history of prostate cancer and ongoing controversy regarding screening and potential treatment outcomes of prostate cancer has been discussed with the patient. The meaning of a false positive PSA and a false negative PSA has been discussed. He indicates understanding of the limitations of this screening test and wishes to proceed with screening PSA testing. Father has history of prostate cancer.   IMMUNIZATIONS:   - Tdap: Tetanus vaccination status reviewed: last tetanus booster within 10 years. Booster due 2024 - Influenza: Administered today  - COVID-19: Up to date - Pneumovax: Not applicable Recommended at age 70 - Prevnar: Not applicable Recommended at age 32 - HPV: Not applicable Recommended from age 87-26 - Shingles vaccine: Not applicable Recommended at age 96  SCREENING: - Colonoscopy: Not applicable Recommend starting at age 32-50 Discussed with patient purpose of the colonoscopy is to detect colon cancer at curable precancerous or Khamron Gellert stages   - AAA Screening: Not applicable  -Hearing Test: Not applicable  -Spirometry: Ordered today   PATIENT  COUNSELING:    Sexuality: Discussed sexually transmitted diseases, partner selection, use of condoms, avoidance of unintended pregnancy  and contraceptive alternatives.   Advised to avoid cigarette smoking.  I discussed with the patient that most people either abstain from alcohol or drink within safe limits (<=14/week and <=4 drinks/occasion for males, <=7/weeks and <= 3 drinks/occasion for females) and that the risk for alcohol disorders and other health effects rises proportionally with the number of drinks per week and how often a drinker  exceeds daily limits.  Discussed cessation/primary prevention of drug use and availability of treatment for abuse.   Diet: Encouraged to adjust caloric intake to maintain  or achieve ideal body weight, to reduce intake of dietary saturated fat and total fat, to limit sodium intake by avoiding high sodium foods and not adding table salt, and to maintain adequate dietary potassium and calcium preferably from fresh fruits, vegetables, and low-fat dairy products.    stressed the importance of regular exercise  Injury prevention: Discussed safety belts, safety helmets, smoke detector, smoking near bedding or upholstery.   Dental health: Discussed importance of regular tooth brushing, flossing, and dental visits.   Follow up plan: NEXT PREVENTATIVE PHYSICAL DUE IN 1 YEAR. Return in about 1 year (around 11/03/2020) for Annual Physical .

## 2019-11-04 NOTE — Assessment & Plan Note (Signed)
Blood pressure 134/89 in the office today.  He does have a history of having some elevated blood pressure readings. We will obtain labs today. Recommend close monitoring of blood pressure to evaluate for the need for additional blood pressure management.

## 2019-11-04 NOTE — Patient Instructions (Addendum)
We will plan to get basic labs for your annual exam today. It is recommended that we monitor:  CBC- white blood cells, red blood cells, hemoglobin  CMP- kidney function, liver function, electrolytes  Lipids- cholesterol  It is recommended that all adults have one time screening to monitor for the following communicable diseases, this helps with Austin Powers detection and baseline monitoring if need arises in the future:  HIV- HIV infection  Hepatitis C- Hepatitis C infection   In some instances, we may also monitor- we will let you know if these additional tests are ordered:  TSH- monitor thyroid function  PSA- prostate antigen  Iron/Ferritin Studies- iron stores in the body- helps determine causes of anemia  Vitamin B12- to determine deficiency based on findings  Vitamin D- to determine deficiency based on findings   If we do not see anything abnormal with your labs, you will likely not hear from [place], but you can view the results on MyChart. If there are any abnormalities, we will be in contact with you to discuss recommendations.  You will likely see the labs on MyChart before I do. Please allow for 48-72 hours from the time labs have resulted for the provider to have the opportunity to review them and contact you.    For your intermittent low back sciatic type pain you can use ice and heat 20 minutes each 3-4 times a day and try a TENS unit to help decrease the severity and length of symptoms. If this becomes constant, please let us know. I will also give you some exercises which can be helpful.   Health Maintenance, Male Adopting a healthy lifestyle and getting preventive care are important in promoting health and wellness. Ask your health care provider about:  The right schedule for you to have regular tests and exams.  Things you can do on your own to prevent diseases and keep yourself healthy. What should I know about diet, weight, and exercise? Eat a healthy diet   Eat a  diet that includes plenty of vegetables, fruits, low-fat dairy products, and lean protein.  Do not eat a lot of foods that are high in solid fats, added sugars, or sodium. Maintain a healthy weight Body mass index (BMI) is a measurement that can be used to identify possible weight problems. It estimates body fat based on height and weight. Your health care provider can help determine your BMI and help you achieve or maintain a healthy weight. Get regular exercise Get regular exercise. This is one of the most important things you can do for your health. Most adults should:  Exercise for at least 150 minutes each week. The exercise should increase your heart rate and make you sweat (moderate-intensity exercise).  Do strengthening exercises at least twice a week. This is in addition to the moderate-intensity exercise.  Spend less time sitting. Even light physical activity can be beneficial. Watch cholesterol and blood lipids Have your blood tested for lipids and cholesterol at 36 years of age, then have this test every 5 years. You may need to have your cholesterol levels checked more often if:  Your lipid or cholesterol levels are high.  You are older than 36 years of age.  You are at high risk for heart disease. What should I know about cancer screening? Many types of cancers can be detected Austin Powers and may often be prevented. Depending on your health history and family history, you may need to have cancer screening at various ages. This may  include screening for:  Colorectal cancer.  Prostate cancer.  Skin cancer.  Lung cancer. What should I know about heart disease, diabetes, and high blood pressure? Blood pressure and heart disease  High blood pressure causes heart disease and increases the risk of stroke. This is more likely to develop in people who have high blood pressure readings, are of African descent, or are overweight.  Talk with your health care provider about your  target blood pressure readings.  Have your blood pressure checked: ? Every 3-5 years if you are 36-6 years of age. ? Every year if you are 47 years old or older.  If you are between the ages of 55 and 11 and are a current or former smoker, ask your health care provider if you should have a one-time screening for abdominal aortic aneurysm (AAA). Diabetes Have regular diabetes screenings. This checks your fasting blood sugar level. Have the screening done:  Once every three years after age 46 if you are at a normal weight and have a low risk for diabetes.  More often and at a younger age if you are overweight or have a high risk for diabetes. What should I know about preventing infection? Hepatitis B If you have a higher risk for hepatitis B, you should be screened for this virus. Talk with your health care provider to find out if you are at risk for hepatitis B infection. Hepatitis C Blood testing is recommended for:  Everyone born from [birth year] through 1967.  Anyone with known risk factors for hepatitis C. Sexually transmitted infections (STIs)  You should be screened each year for STIs, including gonorrhea and chlamydia, if: ? You are sexually active and are younger than 36 years of age. ? You are older than 36 years of age and your health care provider tells you that you are at risk for this type of infection. ? Your sexual activity has changed since you were last screened, and you are at increased risk for chlamydia or gonorrhea. Ask your health care provider if you are at risk.  Ask your health care provider about whether you are at high risk for HIV. Your health care provider may recommend a prescription medicine to help prevent HIV infection. If you choose to take medicine to prevent HIV, you should first get tested for HIV. You should then be tested every 3 months for as long as you are taking the medicine. Follow these instructions at home: Lifestyle  Do not use any products  that contain nicotine or tobacco, such as cigarettes, e-cigarettes, and chewing tobacco. If you need help quitting, ask your health care provider.  Do not use street drugs.  Do not share needles.  Ask your health care provider for help if you need support or information about quitting drugs. Alcohol use  Do not drink alcohol if your health care provider tells you not to drink.  If you drink alcohol: ? Limit how much you have to 0-2 drinks a day. ? Be aware of how much alcohol is in your drink. In the [place] one drink equals one 12 oz bottle of beer (355 mL), one 5 oz glass of wine (148 mL), or one 1 oz glass of hard liquor (44 mL). General instructions  Schedule regular health, dental, and eye exams.  Stay current with your vaccines.  Tell your health care provider if: ? You often feel depressed. ? You have ever been abused or do not feel safe at home. Summary  Adopting a  healthy lifestyle and getting preventive care are important in promoting health and wellness.  Follow your health care provider's instructions about healthy diet, exercising, and getting tested or screened for diseases.  Follow your health care provider's instructions on monitoring your cholesterol and blood pressure. This information is not intended to replace advice given to you by your health care provider. Make sure you discuss any questions you have with your health care provider. Document Revised: 12/20/2017 Document Reviewed: 12/20/2017 Elsevier Patient Education  [place].  Influenza (Flu) Vaccine (Inactivated or Recombinant): What You Need to Know 1. Why get vaccinated? Influenza vaccine can prevent influenza (flu). Flu is a contagious disease that spreads around the [place] every year, usually between October and May. Anyone can get the flu, but it is more dangerous for some people. Infants and young children, people 51 years of age and older, pregnant women, and people with certain  health conditions or a weakened immune system are at greatest risk of flu complications. Pneumonia, bronchitis, sinus infections and ear infections are examples of flu-related complications. If you have a medical condition, such as heart disease, cancer or diabetes, flu can make it worse. Flu can cause fever and chills, sore throat, muscle aches, fatigue, cough, headache, and runny or stuffy nose. Some people may have vomiting and diarrhea, though this is more common in children than adults. Each year thousands of people in the [place] die from flu, and many more are hospitalized. Flu vaccine prevents millions of illnesses and flu-related visits to the doctor each year. 2. Influenza vaccine CDC recommends everyone 8 months of age and older get vaccinated every flu season. Children 6 months through 6 years of age may need 2 doses during a single flu season. Everyone else needs only 1 dose each flu season. It takes about 2 weeks for protection to develop after vaccination. There are many flu viruses, and they are always changing. Each year a new flu vaccine is made to protect against three or four viruses that are likely to cause disease in the upcoming flu season. Even when the vaccine doesn't exactly match these viruses, it may still provide some protection. Influenza vaccine does not cause flu. Influenza vaccine may be given at the same time as other vaccines. 3. Talk with your health care provider Tell your vaccine provider if the person getting the vaccine:  Has had an allergic reaction after a previous dose of influenza vaccine, or has any severe, life-threatening allergies.  Has ever had Guillain-Barr Syndrome (also called GBS). In some cases, your health care provider may decide to postpone influenza vaccination to a future visit. People with minor illnesses, such as a cold, may be vaccinated. People who are moderately or severely ill should usually wait until they recover before  getting influenza vaccine. Your health care provider can give you more information. 4. Risks of a vaccine reaction  Soreness, redness, and swelling where shot is given, fever, muscle aches, and headache can happen after influenza vaccine.  There may be a very small increased risk of Guillain-Barr Syndrome (GBS) after inactivated influenza vaccine (the flu shot). Young children who get the flu shot along with pneumococcal vaccine (PCV13), and/or DTaP vaccine at the same time might be slightly more likely to have a seizure caused by fever. Tell your health care provider if a child who is getting flu vaccine has ever had a seizure. People sometimes faint after medical procedures, including vaccination. Tell your provider if you feel dizzy or  have vision changes or ringing in the ears. As with any medicine, there is a very remote chance of a vaccine causing a severe allergic reaction, other serious injury, or death. 5. What if there is a serious problem? An allergic reaction could occur after the vaccinated person leaves the clinic. If you see signs of a severe allergic reaction (hives, swelling of the face and throat, difficulty breathing, a fast heartbeat, dizziness, or weakness), call 9-1-1 and get the person to the nearest hospital. For other signs that concern you, call your health care provider. Adverse reactions should be reported to the Vaccine Adverse Event Reporting System (VAERS). Your health care provider will usually file this report, or you can do it yourself. Visit the VAERS website at www.vaers.SamedayNews.es or call 9717273870.VAERS is only for reporting reactions, and VAERS staff do not give medical advice. 6. The National Vaccine Injury Compensation Program The [employer] Vaccine Injury Compensation Program (VICP) is a federal program that was created to compensate people who may have been injured by certain vaccines. Visit the VICP website at GoldCloset.com.ee or call  (365)267-5634 to learn about the program and about filing a claim. There is a time limit to file a claim for compensation. 7. How can I learn more?  Ask your healthcare provider.  Call your local or state health department.  Contact the Centers for Disease Control and Prevention (CDC): ? Call 904-394-3384 (1-800-CDC-INFO) or ? Visit CDC's https://gibson.com/ Vaccine Information Statement (Interim) Inactivated Influenza Vaccine (08/24/2017) This information is not intended to replace advice given to you by your health care provider. Make sure you discuss any questions you have with your health care provider. Document Revised: 04/17/2018 Document Reviewed: 08/28/2017 Elsevier Patient Education  [place].

## 2019-11-04 NOTE — Assessment & Plan Note (Signed)
History of iron deficiency anemia. We will obtain labs today to evaluate hemoglobin levels. If hemoglobin is found to be low we will plan to order iron studies for further evaluation.

## 2019-11-04 NOTE — Assessment & Plan Note (Addendum)
Annual physical exam on overall healthy 34 year old male. We will obtain labs today.  Discussed the option of HIV and hepatitis C one-time testing with the patient today.  At this time he does not feel that he has risk factors present for either condition and therefore defers testing at this time. Recommend follow-up in approximately 1 year or sooner if needed. We will notify patient of lab results and discuss changes to the plan of care as needed.

## 2019-11-04 NOTE — Assessment & Plan Note (Signed)
Generalized anxiety disorder well-controlled with current use of Pristiq and propranolol in conjunction with weekly counseling sessions. The patient appears to be doing quite well on the current regimen.  Recommend continuing this. 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 We will update patient on results and make changes to plan of care as appropriate.

## 2019-11-04 NOTE — Assessment & Plan Note (Signed)
Decreased libido in the setting of well-controlled anxiety with Pristiq and propranolol. Discussed with the patient that this is a well-known side effect of these medications.  Unfortunately he was unable to take Wellbutrin which is typically used to help counteract the side effects. We will monitor his testosterone levels as recommended by psychiatry/psychology. If testosterone levels are found to be within the normal limits discussed with the patient the option that he may consider trying Wellbutrin once again and taking for a minimum of 4 weeks has some of the side effects that he noted may go away with continued use.  Will allow psychiatry/psychology to make further recommendations for medication management and will be happy to service support with this.

## 2019-11-04 NOTE — Assessment & Plan Note (Signed)
Reported bilateral hearing loss with difficulty hearing high-pitched sounds and noticed will need to increase television volume.  No cerumen impaction or visible cause for hearing loss noted today. Referral to audiology placed for complete evaluation.

## 2019-11-04 NOTE — Assessment & Plan Note (Signed)
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  No warning signs noted today.  Cyst freely movable, small, painless, with no associated external symptoms on the skin. Discussed with the patient close monitoring of the area and if he begins to notice an increase in size or if the pain he has noticed when laying on hard surface becomes bothersome we can refer for removal. Due to the location and how deep the cystic lesion is I feel that general surgery would be most appropriate for this.

## 2019-11-05 LAB — COMPLETE METABOLIC PANEL WITH GFR: Total Bilirubin: 1 mg/dL (ref 0.2–1.2)

## 2019-11-05 LAB — CBC WITH DIFFERENTIAL/PLATELET: Monocytes Relative: 7.9 %

## 2019-11-05 NOTE — Progress Notes (Signed)
Estle,   Your labs look great! Your LDL (bad cholesterol) is just slightly elevated. Watch your fat and cholesterol intake, as well as your carbs and this will likely come down.   Still waiting on Testosterone- I will let you know when it results.   [name]

## 2019-11-07 LAB — CBC WITH DIFFERENTIAL/PLATELET
Absolute Monocytes: 411 cells/uL (ref 200–950)
Basophils Absolute: 31 cells/uL (ref 0–200)
Basophils Relative: 0.6 %
Eosinophils Absolute: 88 cells/uL (ref 15–500)
Eosinophils Relative: 1.7 %
HCT: 47.3 % (ref 38.5–50.0)
Hemoglobin: 15.9 g/dL (ref 13.2–17.1)
Lymphs Abs: 1326 cells/uL (ref 850–3900)
MCH: 30.8 pg (ref 27.0–33.0)
MCHC: 33.6 g/dL (ref 32.0–36.0)
MCV: 91.7 fL (ref 80.0–100.0)
Neutro Abs: 3344 cells/uL (ref 1500–7800)
Neutrophils Relative %: 64.3 %
Platelets: 298 10*3/uL (ref 140–400)
RBC: 5.16 10*6/uL (ref 4.20–5.80)
RDW: 12.5 % (ref 11.0–15.0)
Total Lymphocyte: 25.5 %
WBC: 5.2 10*3/uL (ref 3.8–10.8)

## 2019-11-07 LAB — COMPLETE METABOLIC PANEL WITH GFR
AG Ratio: 1.6 (calc) (ref 1.0–2.5)
ALT: 32 U/L (ref 9–46)
AST: 26 U/L (ref 10–40)
Alkaline phosphatase (APISO): 94 U/L (ref 36–130)
BUN: 11 mg/dL (ref 7–25)
CO2: 30 mmol/L (ref 20–32)
Calcium: 9.6 mg/dL (ref 8.6–10.3)
Chloride: 101 mmol/L (ref 98–110)
Creat: 0.82 mg/dL (ref 0.60–1.35)
GFR, Est African American: 132 mL/min/{1.73_m2} (ref >=60)
Globulin: 2.9 g/dL (calc) (ref 1.9–3.7)
Glucose, Bld: 94 mg/dL (ref 65–99)
Potassium: 4.6 mmol/L (ref 3.5–5.3)
Sodium: 137 mmol/L (ref 135–146)
Total Protein: 7.6 g/dL (ref 6.1–8.1)

## 2019-11-07 LAB — TESTOSTERONE, FREE & TOTAL
Free Testosterone: 84.1 pg/mL (ref 35.0–155.0)
Testosterone, Total, LC-MS-MS: 561 ng/dL (ref 250–1100)

## 2019-11-07 LAB — LIPID PANEL
Cholesterol: 182 mg/dL (ref <200)
HDL: 40 mg/dL (ref >=40)
LDL Cholesterol (Calc): 120 mg/dL (calc) — ABNORMAL HIGH
Non-HDL Cholesterol (Calc): 142 mg/dL (calc) — ABNORMAL HIGH (ref <130)
Total CHOL/HDL Ratio: 4.6 (calc) (ref <5.0)
Triglycerides: 111 mg/dL (ref <150)

## 2019-11-07 LAB — PSA: PSA: 0.98 ng/mL (ref <=4.0)

## 2019-11-07 NOTE — Progress Notes (Signed)
Austin Powers,  Your testosterone came back and it looks good. I would share this with your psychiatrist and see what they recommend.  Let me know if you have any questions.  [name]

## 2020-08-07 ENCOUNTER — Encounter: Payer: Self-pay | Admitting: Osteopathic Medicine

## 2020-08-07 MED ORDER — OMEPRAZOLE 40 MG PO CPDR: 40.0000 mg | DELAYED_RELEASE_CAPSULE | Freq: Every day | ORAL | 0 refills | Status: DC

## 2020-08-31 ENCOUNTER — Other Ambulatory Visit: Payer: Self-pay | Admitting: Osteopathic Medicine

## 2020-11-29 ENCOUNTER — Other Ambulatory Visit: Payer: Self-pay | Admitting: Osteopathic Medicine

## 2021-02-27 ENCOUNTER — Other Ambulatory Visit: Payer: Self-pay | Admitting: Family Medicine

## 2021-03-01 NOTE — Telephone Encounter (Signed)
LVM for patient to call back to get a TOC appt scheduled. AM

## 2021-03-01 NOTE — Telephone Encounter (Signed)
Please contact patient and have him establish with a PCP

## 2021-12-17 ENCOUNTER — Other Ambulatory Visit: Payer: Self-pay | Admitting: Family Medicine

## 2021-12-17 DIAGNOSIS — R1013 Epigastric pain: Secondary | ICD-10-CM

## 2021-12-21 ENCOUNTER — Ambulatory Visit (INDEPENDENT_AMBULATORY_CARE_PROVIDER_SITE_OTHER): Payer: No Typology Code available for payment source

## 2021-12-21 DIAGNOSIS — R1013 Epigastric pain: Secondary | ICD-10-CM | POA: Diagnosis not present

## 2021-12-21 DIAGNOSIS — R109 Unspecified abdominal pain: Secondary | ICD-10-CM

## 2023-10-25 NOTE — Progress Notes (Signed)
 Patient ID:  Austin Powers is a 38 y.o.  [birth year]   male     Eye Problem (Lt eye redness in the lower part of the eye/under lower eye lid/Gets crusty during the night/Cloudy vision in the mornings)   New problem to me History given by patient:  Patient has been having left eye redness, drainage, and swelling for about 1 week. Patient states it is crusted over in the morning. Accompanied by:   self Duration: 1 week Location:  left eye Quality/intensity: Described as mild to moderate burning and itching Timing: Progressively worsening Eye was matted shut this AM. Headache   -  denies injury to eye, sensation of foreign body. Alleviating factors: Has tried OTC meds with minimal relief Aggravating factors: Opening eyes, bright light   Review of Systems: All others reviewed and negative except as listed above.   Past Medical History, Past surgery History, Allergies, Social history, and Family History were reviewed and updated.   During this patient encounter, the patient was wearing a mask.  Throughout this encounter, I was wearing at least a surgical mask.  I was not within 6 feet of this patient for more than 15 minutes without eye protection when they were not wearing mask.    EXAM:  BP 117/89 (BP Location: Left arm, Patient Position: Sitting)   Pulse 80   Temp 97.7 F (36.5 C) (Oral)   Resp 16   SpO2 99%   Constitutional: Well-nourished, well-developed, no acute distress Neuro: Alert and oriented x3 Head: Atraumatic normocephalic Left Eye: PERRLA, conjunctiva injected, mucoid material medial canthus left eyelid: Edematous and erythematous, palpable mass in lid, tender to palpation Ears: EAC clear, TMs good light reflex Nares: Mucosa minimally erythematous OP: [place] Neck: No lymph lymphadenopathy CV: Regular rate and rhythm without murmur rubs or gallops RESP: Clear to auscultation bilaterally without wheezes rales or rhonchi Skin: No lesions, focal rashes or  ulcerations      No results found for this or any previous visit (from the past 24 hours).     Impression/Plan MDM-Considered emergent conditions (gonococcal conjunctivitis, corneal foreign body, herpes keratitis, corneal ulcer/laceration, acute angle closure glaucoma, MIS-C) as well as more common conditions (bacterial/viral/allergic conjunctivitis, corneal abrasion, acute hemorrhagic conjunctivitis, blepharitis, hordeolum/chalazion, orbital/periorbital cellulitis, episcleritis)    1. Acute bacterial conjunctivitis of left eye (Primary) - ciprofloxacin (CILOXAN) 0.3 % ophthalmic solution; Administer 1 drop, every 2 hours, while awake, for 2 days. Then 1 drop, every 4 hours, while awake, for the next 5 days.  Dispense: 5 mL; Refill: 0     Patient has been instructed on RX/ OTC medications, dosages, side effects, and possible interactions as associated with each diagnosis in my impression and plan above.  2.   Patient education  (verbal/handout) given on diagnosis, pathophysiology, treatment of diagnosis, side effects of medication use for treatment, restrictions while taking medication, supportive    measures such  as  instructed to discard use contacts and not to use contacts until drops finished if applicable.  Instructed to   discard eye make-up and replace with new if applicable.  Instructed not to touch applicator tip on eyelashes or lash.  If that happens medication should be discarded.    Red Flags associated with diagnosis/es were reviewed and patient instructed on action plan if red flags develop.  3.  Patient was instructed on follow up care:         Follow up with PCP in 2-5 days  They have been instructed that if symptoms worse that should go to Urgent Care, go to the nearest ED, or activate EMS.  4.  Patient agreed with plan and voiced understanding.  NO barriers to adherence perceived by myself. 5. If loss of vision occurs, patient advised to go to the ED.

## 2023-10-29 ENCOUNTER — Emergency Department (HOSPITAL_COMMUNITY)
Admission: EM | Admit: 2023-10-29 | Discharge: 2023-10-29 | Disposition: A | Discharge status: Home / Self Care | Attending: Emergency Medicine | Admitting: Emergency Medicine

## 2023-10-29 ENCOUNTER — Other Ambulatory Visit: Payer: Self-pay

## 2023-10-29 DIAGNOSIS — I10 Essential (primary) hypertension: Secondary | ICD-10-CM | POA: Insufficient documentation

## 2023-10-29 DIAGNOSIS — X58XXXA Exposure to other specified factors, initial encounter: Secondary | ICD-10-CM | POA: Diagnosis not present

## 2023-10-29 DIAGNOSIS — S0502XA Injury of conjunctiva and corneal abrasion without foreign body, left eye, initial encounter: Secondary | ICD-10-CM | POA: Insufficient documentation

## 2023-10-29 DIAGNOSIS — J45909 Unspecified asthma, uncomplicated: Secondary | ICD-10-CM | POA: Insufficient documentation

## 2023-10-29 DIAGNOSIS — H5712 Ocular pain, left eye: Secondary | ICD-10-CM | POA: Diagnosis present

## 2023-10-29 MED ORDER — TETRACAINE HCL 0.5 % OP SOLN
1.0000 [drp] | Freq: Once | OPHTHALMIC | Status: AC
  Administered 2023-10-29: 1 [drp] via OPHTHALMIC
  Filled 2023-10-29: qty 4

## 2023-10-29 MED ORDER — FLUORESCEIN SODIUM 1 MG OP STRP
1.0000 | ORAL_STRIP | Freq: Once | OPHTHALMIC | Status: AC
  Administered 2023-10-29: 1 via OPHTHALMIC
  Filled 2023-10-29: qty 1

## 2023-10-29 NOTE — ED Triage Notes (Signed)
 Pt to ed from UC with CC of left eye drainage x 10 days. Pt was given abx drops for 5 days and symptoms have only gotten worst. Pt went back to UC and was told to go to ED to see a eye md. Pt relays light sensitivity.

## 2023-10-29 NOTE — ED Provider Notes (Signed)
 [place] EMERGENCY DEPARTMENT AT [facility] Provider Note   CSN: [id number] Arrival date & time: 10/29/23  1033     Patient presents with: Eye Problem   Austin Powers is a 38 y.o. male with PMHx asthma, HTN who presents to ED concerned for left eye pain/irritation and photophobia. Patient's symptoms initially started around 10 days ago when he was diagnosed with viral conjunctivitis by PCP. Then, patient went to UC last Wednesday since symptoms were still present - UC provider started them on Cipro eye drops. Patient stating that eye pain has been getting worse since then. Denies vision changes. Endorses having to clean his eyes frequently throughout. Denies any other infectious symptoms such as fever, nausea, vomiting, diarrhea. Patient does not wear contacts. He does wear glasses.    Eye Problem      Prior to Admission medications   Medication Sig Start Date End Date Taking? Authorizing Provider  Cholecalciferol 25 MCG (1000 UT) tablet Take 1 tablet by mouth daily.    [provider]  Desvenlafaxine ER (PRISTIQ) 50 MG TB24 Take 1 tablet by mouth daily. 12/02/18   [provider]  famotidine (PEPCID) 20 MG tablet Take 20 mg by mouth 2 (two) times daily as needed for heartburn or indigestion.    [provider]  Fluticasone Propionate, Inhal, 100 MCG/BLIST AEPB Inhale 100 mcg into the lungs as needed. Patient takes 2 sprays as needed    [provider]  Magnesium 400 MG TABS Take 1 tablet by mouth daily. 07/02/19   [provider]  Omega 3-6-9 Fatty Acids (OMEGA 3-6-9 PO) Take 900 mg by mouth.    [provider]  omeprazole  (PRILOSEC) 40 MG capsule Take 1 capsule (40 mg total) by mouth daily. Need to establish care with new PCP 11/30/20   Alvia Bring, DO  propranolol (INDERAL) 10 MG tablet Take 10 mg by mouth 2 (two) times daily. 10/04/19   [provider]  Saw Palmetto 450 MG CAPS Take 1 capsule by mouth 2 (two)  times daily. 01/31/18   [provider]  vitamin C (ASCORBIC ACID) 500 MG tablet Take 500 mg by mouth 2 (two) times daily.    [provider]    Allergies: Cat dander, Dog epithelium, and Gluten meal    Review of Systems  Eyes:  Positive for pain.    Updated Vital Signs BP (!) 125/97 (BP Location: Right Arm)   Pulse (!) 102   Temp 98.5 F (36.9 C)   Resp 17   Ht 5' 8 (1.727 m)   Wt 80 kg   SpO2 96%   BMI 26.82 kg/m   Physical Exam Vitals and nursing note reviewed.  Constitutional:      General: He is not in acute distress.    Appearance: He is not ill-appearing or toxic-appearing.  HENT:     Head: Normocephalic and atraumatic.  Eyes:     General: No scleral icterus.    Comments: Left eye: conjunctival injection of lower cornea.  Fluorescein staining with tiny area of uptake in the lateral cornea.  PERRL.  ROM intact without pain.  IOP 7 with 95% confidence interval.  Cardiovascular:     Rate and Rhythm: Normal rate.  Pulmonary:     Effort: Pulmonary effort is normal.  Abdominal:     General: Abdomen is flat.  Skin:    General: Skin is warm and dry.  Neurological:     General: No focal deficit present.  Mental Status: He is alert. Mental status is at baseline.  Psychiatric:        Mood and Affect: Mood normal.        Behavior: Behavior normal.     (all labs ordered are listed, but only abnormal results are displayed) Labs Reviewed - No data to display  EKG: None  Radiology: No results found.   Procedures   Medications Ordered in the ED  fluorescein ophthalmic strip 1 strip (1 strip Left Eye Given 10/29/23 1136)  tetracaine (PONTOCAINE) 0.5 % ophthalmic solution 1 drop (1 drop Both Eyes Given 10/29/23 1136)                                    Medical Decision Making Risk Prescription drug management.   This patient presents to the ED for concern of eye pain, this involves an extensive number of treatment options, and is a  complaint that carries with it a high risk of complications and morbidity.  The differential diagnosis includes blepharitis, viral/bacterial conjunctivitis, corneal abrasion, dry eye syndrome, subconjunctival hemorrhage, acute angle-closure glaucoma, iritis, keratitis, scleritis   Co morbidities that complicate the patient evaluation  asthma, HTN   Additional history obtained:  Dr. Mickie PCP   Problem List / ED Course / Critical interventions / Medication management  Patient presents ED concern for worsening eye pain since starting Cipro eyedrops 4 days ago.  Physical exam with conjunctival injection of the lower eye and tiny area of fluorescein uptake in lateral eye.  Rest of physical exam reassuring.  Patient afebrile with stable vitals. I have high suspicion that patient's eye irritation is due to ABX eyedrops. I requested consultation with the Dr. Fleeta,  and discussed lab and imaging findings as well as pertinent plan - they recommend: Patient following up in their office later today. Shared results with patient.  Answered all questions.  Patient stating that he is able to head over to the ophthalmology office now.  Provided patient with information.  Patient heading over to ophthalmology office now. Wife is able to drive. I have reviewed the patients home medicines and have made adjustments as needed The patient has been appropriately medically screened and/or stabilized in the ED. I have low suspicion for any other emergent medical condition which would require further screening, evaluation or treatment in the ED or require inpatient management. At time of discharge the patient is hemodynamically stable and in no acute distress. I have discussed work-up results and diagnosis with patient and answered all questions. Patient is agreeable with discharge plan. We discussed strict return precautions for returning to the emergency department and they verbalized understanding.     Social  Determinants of Health:  none       Final diagnoses:  Abrasion of left cornea, initial encounter    ED Discharge Orders     None          Hoy Nidia FALCON, [place] 10/29/23 1330    Doretha Folks, MD 10/29/23 2007

## 2023-10-29 NOTE — Discharge Instructions (Addendum)
 Please go to Dr. Roxianne ophthalmology office at [facility]. [address] [place], [place], [place] [address].

## 2023-10-29 NOTE — ED Provider Triage Note (Signed)
 Emergency Medicine Provider Triage Evaluation Note  Austin Powers , a 38 y.o. male  was evaluated in triage.  Pt complains of started eye drops (cipro) on Wednesday for bacterial conjunctivitis. Patient stating that the purulence has gotten worse. Left eye pain has also gotten worse. Denies vision changes.   UC provider did fluorescin stain and saw something and sent patient to ED to see the eye doctor.    Review of Systems  Positive:  Negative:   Physical Exam  BP (!) 125/97 (BP Location: Right Arm)   Pulse (!) 102   Temp 98.5 F (36.9 C)   Resp 17   Ht 5' 8 (1.727 m)   Wt 80 kg   SpO2 96%   BMI 26.82 kg/m  Gen:   Awake, no distress   Resp:  Normal effort  MSK:   Moves extremities without difficulty  Other:    Medical Decision Making  Medically screening exam initiated at 11:29 AM.  Appropriate orders placed.  Austin Powers was informed that the remainder of the evaluation will be completed by another provider, this initial triage assessment does not replace that evaluation, and the importance of remaining in the ED until their evaluation is complete.     Austin Powers, [place] 10/29/23 1133
